# Patient Record
Sex: Male | Born: 1950 | Race: Black or African American | Hispanic: No | Marital: Married | Smoking: Former smoker
Health system: Southern US, Community
[De-identification: ages and names within clinical notes are randomized; demographics above are authoritative.]

## PROBLEM LIST (undated history)

## (undated) DIAGNOSIS — I10 Essential (primary) hypertension: Secondary | ICD-10-CM

## (undated) DIAGNOSIS — N186 End stage renal disease: Secondary | ICD-10-CM

## (undated) DIAGNOSIS — E1129 Type 2 diabetes mellitus with other diabetic kidney complication: Secondary | ICD-10-CM

## (undated) DIAGNOSIS — Z992 Dependence on renal dialysis: Secondary | ICD-10-CM

## (undated) DIAGNOSIS — I2510 Atherosclerotic heart disease of native coronary artery without angina pectoris: Secondary | ICD-10-CM

## (undated) HISTORY — PX: BELOW KNEE LEG AMPUTATION: SUR23

## (undated) HISTORY — PX: PEG PLACEMENT: SHX5437

---

## 2017-08-23 ENCOUNTER — Other Ambulatory Visit (HOSPITAL_COMMUNITY): Payer: Medicaid Other

## 2017-08-23 ENCOUNTER — Inpatient Hospital Stay
Admission: EM | Admit: 2017-08-23 | Discharge: 2017-09-08 | Disposition: A | Discharge status: Other Acute Inpatient Hospital | Payer: Medicaid Other | Source: Other Acute Inpatient Hospital | Attending: Internal Medicine | Admitting: Internal Medicine

## 2017-08-23 DIAGNOSIS — D499 Neoplasm of unspecified behavior of unspecified site: Secondary | ICD-10-CM

## 2017-08-23 DIAGNOSIS — J9690 Respiratory failure, unspecified, unspecified whether with hypoxia or hypercapnia: Secondary | ICD-10-CM

## 2017-08-23 LAB — TSH: TSH: 0.766 u[IU]/mL (ref 0.350–4.500)

## 2017-08-23 MED ORDER — IOPAMIDOL (ISOVUE-300) INJECTION 61%
INTRAVENOUS | Status: AC
  Filled 2017-08-23: qty 50

## 2017-08-24 LAB — CBC
HCT: 29.4 % — ABNORMAL LOW (ref 39.0–52.0)
Hemoglobin: 9.8 g/dL — ABNORMAL LOW (ref 13.0–17.0)
MCH: 26.5 pg (ref 26.0–34.0)
MCHC: 33.3 g/dL (ref 30.0–36.0)
MCV: 79.5 fL (ref 78.0–100.0)
PLATELETS: 289 10*3/uL (ref 150–400)
RBC: 3.7 MIL/uL — ABNORMAL LOW (ref 4.22–5.81)
RDW: 19.8 % — AB (ref 11.5–15.5)
WBC: 10 10*3/uL (ref 4.0–10.5)

## 2017-08-24 LAB — COMPREHENSIVE METABOLIC PANEL
ALBUMIN: 2.3 g/dL — AB (ref 3.5–5.0)
ALT: 39 U/L (ref 17–63)
AST: 30 U/L (ref 15–41)
Alkaline Phosphatase: 123 U/L (ref 38–126)
Anion gap: 14 (ref 5–15)
BUN: 42 mg/dL — AB (ref 6–20)
CHLORIDE: 94 mmol/L — AB (ref 101–111)
CO2: 24 mmol/L (ref 22–32)
CREATININE: 6.11 mg/dL — AB (ref 0.61–1.24)
Calcium: 8.5 mg/dL — ABNORMAL LOW (ref 8.9–10.3)
GFR calc Af Amer: 10 mL/min — ABNORMAL LOW (ref >60)
GFR calc non Af Amer: 9 mL/min — ABNORMAL LOW (ref >60)
GLUCOSE: 199 mg/dL — AB (ref 65–99)
POTASSIUM: 3.5 mmol/L (ref 3.5–5.1)
SODIUM: 132 mmol/L — AB (ref 135–145)
Total Bilirubin: 0.4 mg/dL (ref 0.3–1.2)
Total Protein: 5.7 g/dL — ABNORMAL LOW (ref 6.5–8.1)

## 2017-08-24 LAB — PROTIME-INR
INR: 1.07
Prothrombin Time: 13.9 seconds (ref 11.4–15.2)

## 2017-08-25 MED ORDER — GLUCOSE 40 % PO GEL
15.00 g | ORAL | Status: DC
Start: ? — End: 2017-08-25

## 2017-08-25 MED ORDER — FENTANYL CITRATE (PF) 2500 MCG/50ML IJ SOLN
25.00 | INTRAMUSCULAR | Status: DC
Start: ? — End: 2017-08-25

## 2017-08-25 MED ORDER — GENERIC EXTERNAL MEDICATION
0.00 | Status: DC
Start: ? — End: 2017-08-25

## 2017-08-25 MED ORDER — QUETIAPINE FUMARATE 25 MG PO TABS
25.00 | ORAL_TABLET | ORAL | Status: DC
Start: 2017-08-23 — End: 2017-08-25

## 2017-08-25 MED ORDER — GENERIC EXTERNAL MEDICATION
5.00 | Status: DC
Start: ? — End: 2017-08-25

## 2017-08-25 MED ORDER — CARVEDILOL 12.5 MG PO TABS
25.00 | ORAL_TABLET | ORAL | Status: DC
Start: 2017-08-23 — End: 2017-08-25

## 2017-08-25 MED ORDER — ALBUTEROL SULFATE 1.25 MG/3ML IN NEBU
1.25 | INHALATION_SOLUTION | RESPIRATORY_TRACT | Status: DC
Start: ? — End: 2017-08-25

## 2017-08-25 MED ORDER — ASPIRIN 81 MG PO CHEW
81.00 | CHEWABLE_TABLET | ORAL | Status: DC
Start: 2017-08-24 — End: 2017-08-25

## 2017-08-25 MED ORDER — FAMOTIDINE 20 MG/2ML IV SOLN
20.00 | INTRAVENOUS | Status: DC
Start: 2017-08-24 — End: 2017-08-25

## 2017-08-25 MED ORDER — ACETAMINOPHEN 650 MG/20.3ML PO SOLN
650.00 | ORAL | Status: DC
Start: ? — End: 2017-08-25

## 2017-08-25 MED ORDER — ATORVASTATIN CALCIUM 40 MG PO TABS
40.00 | ORAL_TABLET | ORAL | Status: DC
Start: 2017-08-23 — End: 2017-08-25

## 2017-08-25 MED ORDER — CALCIUM ACETATE (PHOS BINDER) 667 MG PO CAPS
667.00 | ORAL_CAPSULE | ORAL | Status: DC
Start: 2017-08-23 — End: 2017-08-25

## 2017-08-25 MED ORDER — HEPARIN SODIUM (PORCINE) 5000 UNIT/ML IJ SOLN
5000.00 | INTRAMUSCULAR | Status: DC
Start: 2017-08-23 — End: 2017-08-25

## 2017-08-25 MED ORDER — GENERIC EXTERNAL MEDICATION
1.00 | Status: DC
Start: ? — End: 2017-08-25

## 2017-08-25 MED ORDER — VANCOMYCIN HCL 50 MG/ML PO SOLR
125.00 | ORAL | Status: DC
Start: 2017-08-23 — End: 2017-08-25

## 2017-08-25 MED ORDER — HYDRALAZINE HCL 20 MG/ML IJ SOLN
20.00 | INTRAMUSCULAR | Status: DC
Start: ? — End: 2017-08-25

## 2017-08-25 MED ORDER — EPOETIN ALFA 10000 UNIT/ML IJ SOLN
10000.00 | INTRAMUSCULAR | Status: DC
Start: 2017-08-25 — End: 2017-08-25

## 2017-08-25 MED ORDER — DEXTROSE 50 % IV SOLN
12.00 g | INTRAVENOUS | Status: DC
Start: ? — End: 2017-08-25

## 2017-08-25 MED ORDER — INSULIN LISPRO 100 UNIT/ML ~~LOC~~ SOLN
2.00 | SUBCUTANEOUS | Status: DC
Start: 2017-08-23 — End: 2017-08-25

## 2017-08-25 NOTE — Consult Note (Signed)
[facility] CONSULT NOTE    Date: 08/25/2017                  Patient Name:  Michael Moody  MRN: [id number]  DOB: 1950-08-01  Age / Sex: 67 y.o., male         PCP: System, Pcp Not In                 Service Requesting Consult: Hospitalist                 Reason for Consult: Management of ESRD            History of Present Illness: Patient is a 67 y.o. male with a PMHx of end-stage renal disease on hemodialysis, hypertension, diabetes mellitus, coronary disease status post CABG, peripheral vascular disease status post left below the knee amputation, anemia of chronic kidney disease, and secondary hyperparathyroidism, who was admitted to [facility] on 08/23/2017 for ongoing management of respiratory failure, encephalopathy, and end-stage renal disease.  Unfortunately the patient was found at home on July 30, 2017 with PEA arrest.  He was placed on hypothermia protocol initially.  The patient then subsequently had aspiration pneumonia.  He suffered 2 additional cardiac arrest on August 04, 2017 as well as August 22, 2017.  He also had a PEG tube placement performed on August 09, 2017.  He is remained encephalopathic for most of this time.  He also had dialysis support at the outside hospital.   Medications: Current medications:  Humalog insulin sliding scale, Aranesp 100 mcg subcutaneous weekly, aspirin 81 mg daily, Lipitor 40 mg nightly, calcium acetate 667 mg 3 times daily, carvedilol 25 mg twice daily, Pepcid 20 mg daily, heparin 5000 units twice daily, oxycodone 5 mg every 8 hours, Seroquel 25 mg nightly, vancomycin 125 mg oral 4 times daily  Allergies: No known drug allergies   Past Medical History: end-stage renal disease on hemodialysis, hypertension, diabetes mellitus, coronary disease status post CABG, peripheral vascular disease status post left below the knee amputation, anemia of chronic kidney disease, and secondary  hyperparathyroidism  Past Surgical History: Left BKA PEG tube placement PermCath placement  Family History: Unable to obtain from the patient has he is currently intubated.  Social History: Unable to obtain from the patient as he is currently intubated.  Review of Systems: Unable to obtain from the patient is is currently intubated.  Vital Signs: Temperature 98.7 pulse 78 respirations 18 blood pressure 141/65 Weight trends: There were no vitals filed for this visit.  Physical Exam: General: Critically ill appearing  Head: Normocephalic, atraumatic.  Eyes: Anicteric, EOMI  Nose: Mucous membranes moist, not inflammed, nonerythematous.  Throat: ETT in place  Neck: Supple, trachea midline.  Lungs:  Scattered rhonchi, normal effort  Heart: RRR. S1 and S2 normal without gallop, murmur, or rubs.  Abdomen:  BS normoactive. Soft, Nondistended, non-tender.  No masses or organomegaly.  Extremities: 1+ peripheral edema  Neurologic: Not following commands, intubated  Skin: No visible rashes, scars.    Lab results: Basic Metabolic Panel: Recent Labs  Lab 08/24/17 0825  NA 132*  K 3.5  CL 94*  CO2 24  GLUCOSE 199*  BUN 42*  CREATININE 6.11*  CALCIUM 8.5*    Liver Function Tests: Recent Labs  Lab 08/24/17 0825  AST 30  ALT 39  ALKPHOS 123  BILITOT 0.4  PROT 5.7*  ALBUMIN 2.3*   No results for input(s): LIPASE, AMYLASE in the last 168 hours. No  results for input(s): AMMONIA in the last 168 hours.  CBC: Recent Labs  Lab 08/24/17 0825  WBC 10.0  HGB 9.8*  HCT 29.4*  MCV 79.5  PLT 289    Cardiac Enzymes: No results for input(s): CKTOTAL, CKMB, CKMBINDEX, TROPONINI in the last 168 hours.  BNP: Invalid input(s): POCBNP  CBG: No results for input(s): GLUCAP in the last 168 hours.  Microbiology: No results found for this or any previous visit.  Coagulation Studies: Recent Labs    08/24/17 0825  LABPROT 13.9  INR 1.07    Urinalysis: No results  for input(s): COLORURINE, LABSPEC, PHURINE, GLUCOSEU, HGBUR, BILIRUBINUR, KETONESUR, PROTEINUR, UROBILINOGEN, NITRITE, LEUKOCYTESUR in the last 72 hours.  Invalid input(s): APPERANCEUR    Imaging: Dg Abdomen Peg Tube Location  Result Date: 08/23/2017 CLINICAL DATA:  Percutaneous gastrostomy tube placement. EXAM: ABDOMEN - 1 VIEW COMPARISON:  None. FINDINGS: Percutaneous gastrostomy tube is seen in appropriate position, with injected oral contrast material within the stomach. No evidence of contrast leak. The bowel gas pattern is normal. IMPRESSION: Percutaneous gastrostomy tube in appropriate position within the stomach. Electronically Signed   By: Earle Gell M.D.   On: 08/23/2017 17:13      Assessment & Plan: Pt is a 67 y.o. male with a PMHx of end-stage renal disease on hemodialysis, hypertension, diabetes mellitus, coronary disease status post CABG, peripheral vascular disease status post left below the knee amputation, anemia of chronic kidney disease, and secondary hyperparathyroidism, who was admitted to [facility] on 08/23/2017 for ongoing management of respiratory failure, encephalopathy, and end-stage renal disease.   1.  ESRD on HD TTS.  We will plan for hemodialysis again tomorrow.  Serum potassium currently acceptable at 3.5.  2.  Anemia of chronic kidney disease.  Hemoglobin 9.8.  Maintain the patient on Aranesp 100 mcg subcu tennis weekly.  3.  Secondary hyperparathyroidism.  Maintain the patient on calcium acetate and check intact PTH as well as phosphorus.  4.  Acute respiratory failure.  The patient remains on the ventilator.  Suspect that he will need a tracheostomy in the relative near future.

## 2017-08-26 LAB — RENAL FUNCTION PANEL
Albumin: 2.2 g/dL — ABNORMAL LOW (ref 3.5–5.0)
Anion gap: 13 (ref 5–15)
BUN: 67 mg/dL — AB (ref 6–20)
CALCIUM: 9.1 mg/dL (ref 8.9–10.3)
CHLORIDE: 91 mmol/L — AB (ref 101–111)
CO2: 27 mmol/L (ref 22–32)
CREATININE: 8.57 mg/dL — AB (ref 0.61–1.24)
GFR, EST AFRICAN AMERICAN: 7 mL/min — AB (ref >60)
GFR, EST NON AFRICAN AMERICAN: 6 mL/min — AB (ref >60)
Glucose, Bld: 228 mg/dL — ABNORMAL HIGH (ref 65–99)
Phosphorus: 2 mg/dL — ABNORMAL LOW (ref 2.5–4.6)
Potassium: 3.5 mmol/L (ref 3.5–5.1)
Sodium: 131 mmol/L — ABNORMAL LOW (ref 135–145)

## 2017-08-26 LAB — CBC
HCT: 29.1 % — ABNORMAL LOW (ref 39.0–52.0)
Hemoglobin: 9.4 g/dL — ABNORMAL LOW (ref 13.0–17.0)
MCH: 25.4 pg — ABNORMAL LOW (ref 26.0–34.0)
MCHC: 32.3 g/dL (ref 30.0–36.0)
MCV: 78.6 fL (ref 78.0–100.0)
PLATELETS: 359 10*3/uL (ref 150–400)
RBC: 3.7 MIL/uL — AB (ref 4.22–5.81)
RDW: 20.1 % — AB (ref 11.5–15.5)
WBC: 8.6 10*3/uL (ref 4.0–10.5)

## 2017-08-27 LAB — HEPATITIS B SURFACE ANTIGEN: Hepatitis B Surface Ag: NEGATIVE

## 2017-08-27 LAB — HEPATITIS B SURFACE ANTIBODY,QUALITATIVE: HEP B S AB: REACTIVE

## 2017-08-27 LAB — HEPATITIS B CORE ANTIBODY, TOTAL: HEP B C TOTAL AB: POSITIVE — AB

## 2017-08-27 LAB — PARATHYROID HORMONE, INTACT (NO CA): PTH: 58 pg/mL (ref 15–65)

## 2017-08-27 NOTE — Progress Notes (Signed)
  [facility]  ROUNDING NOTE   Subjective:  Patient seen at bedside. Remains critically ill. Remains on the ventilator at this time.    Objective:  Vital signs in last 24 hours:  Temperature 97.3 pulse 67 respirations 7 blood pressure 115/67  Physical Exam: General: Critically ill appearing  Head: ETT in place  Eyes: Anicteric  Neck: Supple, trachea midline  Lungs:  Scattered rhonchi, vent assisted  Heart: S1S2 no rubs  Abdomen:  Soft, nontender, bowel sounds present  Extremities: trace peripheral edema, L BKA  Neurologic: Arousable, hands in restraints  Skin: No lesions  Access: L IJ Permcath    Basic Metabolic Panel: Recent Labs  Lab 08/24/17 0825 08/26/17 0546  NA 132* 131*  K 3.5 3.5  CL 94* 91*  CO2 24 27  GLUCOSE 199* 228*  BUN 42* 67*  CREATININE 6.11* 8.57*  CALCIUM 8.5* 9.1  PHOS  --  2.0*    Liver Function Tests: Recent Labs  Lab 08/24/17 0825 08/26/17 0546  AST 30  --   ALT 39  --   ALKPHOS 123  --   BILITOT 0.4  --   PROT 5.7*  --   ALBUMIN 2.3* 2.2*   No results for input(s): LIPASE, AMYLASE in the last 168 hours. No results for input(s): AMMONIA in the last 168 hours.  CBC: Recent Labs  Lab 08/24/17 0825 08/26/17 0546  WBC 10.0 8.6  HGB 9.8* 9.4*  HCT 29.4* 29.1*  MCV 79.5 78.6  PLT 289 359    Cardiac Enzymes: No results for input(s): CKTOTAL, CKMB, CKMBINDEX, TROPONINI in the last 168 hours.  BNP: Invalid input(s): POCBNP  CBG: No results for input(s): GLUCAP in the last 168 hours.  Microbiology: No results found for this or any previous visit.  Coagulation Studies: No results for input(s): LABPROT, INR in the last 72 hours.  Urinalysis: No results for input(s): COLORURINE, LABSPEC, PHURINE, GLUCOSEU, HGBUR, BILIRUBINUR, KETONESUR, PROTEINUR, UROBILINOGEN, NITRITE, LEUKOCYTESUR in the last 72 hours.  Invalid input(s): APPERANCEUR    Imaging: No results found.   Medications:        Assessment/ Plan:  67 y.o. male with a PMHx of end-stage renal disease on hemodialysis, hypertension, diabetes mellitus, coronary disease status post CABG, peripheral vascular disease status post left below the knee amputation, anemia of chronic kidney disease, and secondary hyperparathyroidism, who was admitted to [facility] on 08/23/2017 for ongoing management of respiratory failure, encephalopathy, and end-stage renal disease.   1.  ESRD on HD TTS.    Patient will continue hemodialysis on Tuesday, Thursday, Saturday.  Next treatment therefore will be tomorrow.  Ultrafiltration target 1.5 kg.  2.  Anemia of chronic kidney disease.    Maintain the patient on Aranesp 60 mcg subcutaneous weekly.  3.  Secondary hyperparathyroidism.    Phosphorus 2.0 and acceptable.  Continue to monitor.  4.  Acute respiratory failure.  Patient maintained on the ventilator at this time.  Tracheostomy being considered.     LOS: 0 Tymesha Ditmore 1/30/20194:05 PM

## 2017-08-28 LAB — CBC
HCT: 27 % — ABNORMAL LOW (ref 39.0–52.0)
Hemoglobin: 8.5 g/dL — ABNORMAL LOW (ref 13.0–17.0)
MCH: 24.7 pg — AB (ref 26.0–34.0)
MCHC: 31.5 g/dL (ref 30.0–36.0)
MCV: 78.5 fL (ref 78.0–100.0)
PLATELETS: 276 10*3/uL (ref 150–400)
RBC: 3.44 MIL/uL — AB (ref 4.22–5.81)
RDW: 19.5 % — ABNORMAL HIGH (ref 11.5–15.5)
WBC: 7.8 10*3/uL (ref 4.0–10.5)

## 2017-08-28 LAB — RENAL FUNCTION PANEL
Albumin: 2.2 g/dL — ABNORMAL LOW (ref 3.5–5.0)
Anion gap: 13 (ref 5–15)
BUN: 70 mg/dL — ABNORMAL HIGH (ref 6–20)
CALCIUM: 9 mg/dL (ref 8.9–10.3)
CO2: 23 mmol/L (ref 22–32)
CREATININE: 8.53 mg/dL — AB (ref 0.61–1.24)
Chloride: 95 mmol/L — ABNORMAL LOW (ref 101–111)
GFR, EST AFRICAN AMERICAN: 7 mL/min — AB (ref >60)
GFR, EST NON AFRICAN AMERICAN: 6 mL/min — AB (ref >60)
Glucose, Bld: 182 mg/dL — ABNORMAL HIGH (ref 65–99)
Phosphorus: 2.1 mg/dL — ABNORMAL LOW (ref 2.5–4.6)
Potassium: 4.4 mmol/L (ref 3.5–5.1)
SODIUM: 131 mmol/L — AB (ref 135–145)

## 2017-08-29 NOTE — Progress Notes (Signed)
  [facility]  ROUNDING NOTE   Subjective:  Patient remains critically ill at this point in time. He remains on the ventilator. It appears that he will most likely be getting a tracheostomy.    Objective:  Vital signs in last 24 hours:  Temperature 97.4 pulse 70 respirations 18 blood pressure 107/67  Physical Exam: General: Critically ill appearing  Head: ETT in place  Eyes: Anicteric  Neck: Supple, trachea midline  Lungs:  Scattered rhonchi, vent assisted  Heart: S1S2 no rubs  Abdomen:  Soft, nontender, bowel sounds present  Extremities: trace peripheral edema, L BKA  Neurologic: Arousable, hands in restraints  Skin: No lesions  Access: L IJ Permcath    Basic Metabolic Panel: Recent Labs  Lab 08/24/17 0825 08/26/17 0546 08/28/17 0739  NA 132* 131* 131*  K 3.5 3.5 4.4  CL 94* 91* 95*  CO2 24 27 23   GLUCOSE 199* 228* 182*  BUN 42* 67* 70*  CREATININE 6.11* 8.57* 8.53*  CALCIUM 8.5* 9.1 9.0  PHOS  --  2.0* 2.1*    Liver Function Tests: Recent Labs  Lab 08/24/17 0825 08/26/17 0546 08/28/17 0739  AST 30  --   --   ALT 39  --   --   ALKPHOS 123  --   --   BILITOT 0.4  --   --   PROT 5.7*  --   --   ALBUMIN 2.3* 2.2* 2.2*   No results for input(s): LIPASE, AMYLASE in the last 168 hours. No results for input(s): AMMONIA in the last 168 hours.  CBC: Recent Labs  Lab 08/24/17 0825 08/26/17 0546 08/28/17 0739  WBC 10.0 8.6 7.8  HGB 9.8* 9.4* 8.5*  HCT 29.4* 29.1* 27.0*  MCV 79.5 78.6 78.5  PLT 289 359 276    Cardiac Enzymes: No results for input(s): CKTOTAL, CKMB, CKMBINDEX, TROPONINI in the last 168 hours.  BNP: Invalid input(s): POCBNP  CBG: No results for input(s): GLUCAP in the last 168 hours.  Microbiology: No results found for this or any previous visit.  Coagulation Studies: No results for input(s): LABPROT, INR in the last 72 hours.  Urinalysis: No results for input(s): COLORURINE, LABSPEC, PHURINE, GLUCOSEU, HGBUR,  BILIRUBINUR, KETONESUR, PROTEINUR, UROBILINOGEN, NITRITE, LEUKOCYTESUR in the last 72 hours.  Invalid input(s): APPERANCEUR    Imaging: No results found.   Medications:       Assessment/ Plan:  67 y.o. male with a PMHx of end-stage renal disease on hemodialysis, hypertension, diabetes mellitus, coronary disease status post CABG, peripheral vascular disease status post left below the knee amputation, anemia of chronic kidney disease, and secondary hyperparathyroidism, who was admitted to [facility] on 08/23/2017 for ongoing management of respiratory failure, encephalopathy, and end-stage renal disease.   1.  ESRD on HD TTS.    We will plan for dialysis again tomorrow. Serum electrolytes appear acceptable at the moment.  2.  Anemia of chronic kidney disease.    Hemoglobin down a bit to 8.5.  Maintain the patient on Aranesp 177mcg [place] weekly.   3.  Secondary hyperparathyroidism.    Phosphorus currently 2.2 and acceptable.  4.  Acute respiratory failure.  Patient has failed multiple weaning trials.  Has low tidal volume during weaning trials.  Maintain ventilatory support.  Most likely patient will have tracheostomy next week.   LOS: 0 Juneau Doughman 2/1/20198:24 AM

## 2017-08-30 LAB — RENAL FUNCTION PANEL
ALBUMIN: 2.2 g/dL — AB (ref 3.5–5.0)
ANION GAP: 12 (ref 5–15)
BUN: 52 mg/dL — AB (ref 6–20)
CHLORIDE: 94 mmol/L — AB (ref 101–111)
CO2: 24 mmol/L (ref 22–32)
Calcium: 8.7 mg/dL — ABNORMAL LOW (ref 8.9–10.3)
Creatinine, Ser: 6.74 mg/dL — ABNORMAL HIGH (ref 0.61–1.24)
GFR calc Af Amer: 9 mL/min — ABNORMAL LOW (ref >60)
GFR calc non Af Amer: 8 mL/min — ABNORMAL LOW (ref >60)
GLUCOSE: 185 mg/dL — AB (ref 65–99)
PHOSPHORUS: 2.3 mg/dL — AB (ref 2.5–4.6)
POTASSIUM: 4 mmol/L (ref 3.5–5.1)
Sodium: 130 mmol/L — ABNORMAL LOW (ref 135–145)

## 2017-08-30 LAB — CBC
HEMATOCRIT: 26.1 % — AB (ref 39.0–52.0)
HEMOGLOBIN: 8.4 g/dL — AB (ref 13.0–17.0)
MCH: 25.5 pg — AB (ref 26.0–34.0)
MCHC: 32.2 g/dL (ref 30.0–36.0)
MCV: 79.3 fL (ref 78.0–100.0)
Platelets: 284 10*3/uL (ref 150–400)
RBC: 3.29 MIL/uL — ABNORMAL LOW (ref 4.22–5.81)
RDW: 19.8 % — AB (ref 11.5–15.5)
WBC: 5 10*3/uL (ref 4.0–10.5)

## 2017-09-01 NOTE — Progress Notes (Signed)
  [facility]  ROUNDING NOTE   Subjective:  Patient seen at bedside. He remains on the ventilator at the moment. Unclear as to when he will receive a tracheostomy.   Objective:  Vital signs in last 24 hours:  Pulse 73 respirations 10 blood pressure 124/69 98% pulse ox  Physical Exam: General: Critically ill appearing  Head: ETT in place  Eyes: Anicteric  Neck: Supple, trachea midline  Lungs:  Scattered rhonchi, vent assisted  Heart: S1S2 no rubs  Abdomen:  Soft, nontender, bowel sounds present  Extremities: trace peripheral edema, L BKA  Neurologic: Arousable, hands in restraints  Skin: No lesions  Access: L IJ Permcath    Basic Metabolic Panel: Recent Labs  Lab 08/26/17 0546 08/28/17 0739 08/30/17 0528  NA 131* 131* 130*  K 3.5 4.4 4.0  CL 91* 95* 94*  CO2 27 23 24   GLUCOSE 228* 182* 185*  BUN 67* 70* 52*  CREATININE 8.57* 8.53* 6.74*  CALCIUM 9.1 9.0 8.7*  PHOS 2.0* 2.1* 2.3*    Liver Function Tests: Recent Labs  Lab 08/26/17 0546 08/28/17 0739 08/30/17 0528  ALBUMIN 2.2* 2.2* 2.2*   No results for input(s): LIPASE, AMYLASE in the last 168 hours. No results for input(s): AMMONIA in the last 168 hours.  CBC: Recent Labs  Lab 08/26/17 0546 08/28/17 0739 08/30/17 0528  WBC 8.6 7.8 5.0  HGB 9.4* 8.5* 8.4*  HCT 29.1* 27.0* 26.1*  MCV 78.6 78.5 79.3  PLT 359 276 284    Cardiac Enzymes: No results for input(s): CKTOTAL, CKMB, CKMBINDEX, TROPONINI in the last 168 hours.  BNP: Invalid input(s): POCBNP  CBG: No results for input(s): GLUCAP in the last 168 hours.  Microbiology: No results found for this or any previous visit.  Coagulation Studies: No results for input(s): LABPROT, INR in the last 72 hours.  Urinalysis: No results for input(s): COLORURINE, LABSPEC, PHURINE, GLUCOSEU, HGBUR, BILIRUBINUR, KETONESUR, PROTEINUR, UROBILINOGEN, NITRITE, LEUKOCYTESUR in the last 72 hours.  Invalid input(s): APPERANCEUR     Imaging: No results found.   Medications:       Assessment/ Plan:  67 y.o. male with a PMHx of end-stage renal disease on hemodialysis, hypertension, diabetes mellitus, coronary disease status post CABG, peripheral vascular disease status post left below the knee amputation, anemia of chronic kidney disease, and secondary hyperparathyroidism, who was admitted to [facility] on 08/23/2017 for ongoing management of respiratory failure, encephalopathy, and end-stage renal disease.   1.  ESRD on HD TTS.    Patient seen and evaluated at bedside.  No urgent indication for dialysis today.  We will plan for dialysis again tomorrow.  2.  Anemia of chronic kidney disease.    Hemoglobin currently 8.4.  We will maintain the patient on Aranesp 100 mcg subcutaneous weekly.  3.  Secondary hyperparathyroidism.    Repeat serum phosphorus tomorrow..  4.  Acute respiratory failure.  As before suspect that the patient will require tracheostomy placement in the relative near future.   LOS: 0 Ralynn San 2/4/20192:49 PM

## 2017-09-02 ENCOUNTER — Other Ambulatory Visit (HOSPITAL_COMMUNITY): Payer: Medicaid Other

## 2017-09-02 LAB — CBC
HCT: 24.8 % — ABNORMAL LOW (ref 39.0–52.0)
HEMOGLOBIN: 7.5 g/dL — AB (ref 13.0–17.0)
MCH: 24.1 pg — ABNORMAL LOW (ref 26.0–34.0)
MCHC: 30.2 g/dL (ref 30.0–36.0)
MCV: 79.7 fL (ref 78.0–100.0)
Platelets: 317 10*3/uL (ref 150–400)
RBC: 3.11 MIL/uL — AB (ref 4.22–5.81)
RDW: 19.6 % — ABNORMAL HIGH (ref 11.5–15.5)
WBC: 5.8 10*3/uL (ref 4.0–10.5)

## 2017-09-02 LAB — BLOOD GAS, ARTERIAL
ACID-BASE EXCESS: 4.4 mmol/L — AB (ref 0.0–2.0)
Bicarbonate: 29 mmol/L — ABNORMAL HIGH (ref 20.0–28.0)
FIO2: 0.28
LHR: 18 {breaths}/min
MECHVT: 500 mL
O2 SAT: 98.3 %
PATIENT TEMPERATURE: 98.6
PCO2 ART: 48 mmHg (ref 32.0–48.0)
PEEP/CPAP: 5 cmH2O
PO2 ART: 117 mmHg — AB (ref 83.0–108.0)
pH, Arterial: 7.398 (ref 7.350–7.450)

## 2017-09-02 LAB — RENAL FUNCTION PANEL
ANION GAP: 14 (ref 5–15)
Albumin: 2.3 g/dL — ABNORMAL LOW (ref 3.5–5.0)
BUN: 59 mg/dL — ABNORMAL HIGH (ref 6–20)
CHLORIDE: 90 mmol/L — AB (ref 101–111)
CO2: 25 mmol/L (ref 22–32)
Calcium: 9 mg/dL (ref 8.9–10.3)
Creatinine, Ser: 6.84 mg/dL — ABNORMAL HIGH (ref 0.61–1.24)
GFR calc non Af Amer: 7 mL/min — ABNORMAL LOW (ref >60)
GFR, EST AFRICAN AMERICAN: 9 mL/min — AB (ref >60)
Glucose, Bld: 149 mg/dL — ABNORMAL HIGH (ref 65–99)
POTASSIUM: 4 mmol/L (ref 3.5–5.1)
Phosphorus: 3.8 mg/dL (ref 2.5–4.6)
Sodium: 129 mmol/L — ABNORMAL LOW (ref 135–145)

## 2017-09-03 NOTE — Progress Notes (Signed)
[facility]  ROUNDING NOTE   Subjective:  The patient remains on the ventilator at this time. He continues dialysis on a Tuesday, Thursday, Saturday schedule.   Objective:  Vital signs in last 24 hours:  Temperature 98.8 pulse 72 respirations 18 blood pressure 108/58  Physical Exam: General: Critically ill appearing  Head: ETT in place  Eyes: Anicteric  Neck: Supple, trachea midline  Lungs:  Scattered rhonchi, vent assisted  Heart: S1S2 no rubs  Abdomen:  Soft, nontender, bowel sounds present  Extremities: trace peripheral edema, L BKA  Neurologic: Arousable, hands in restraints  Skin: No lesions  Access: L IJ Permcath    Basic Metabolic Panel: Recent Labs  Lab 08/28/17 0739 08/30/17 0528 09/02/17 0522  NA 131* 130* 129*  K 4.4 4.0 4.0  CL 95* 94* 90*  CO2 23 24 25   GLUCOSE 182* 185* 149*  BUN 70* 52* 59*  CREATININE 8.53* 6.74* 6.84*  CALCIUM 9.0 8.7* 9.0  PHOS 2.1* 2.3* 3.8    Liver Function Tests: Recent Labs  Lab 08/28/17 0739 08/30/17 0528 09/02/17 0522  ALBUMIN 2.2* 2.2* 2.3*   No results for input(s): LIPASE, AMYLASE in the last 168 hours. No results for input(s): AMMONIA in the last 168 hours.  CBC: Recent Labs  Lab 08/28/17 0739 08/30/17 0528 09/02/17 0522  WBC 7.8 5.0 5.8  HGB 8.5* 8.4* 7.5*  HCT 27.0* 26.1* 24.8*  MCV 78.5 79.3 79.7  PLT 276 284 317    Cardiac Enzymes: No results for input(s): CKTOTAL, CKMB, CKMBINDEX, TROPONINI in the last 168 hours.  BNP: Invalid input(s): POCBNP  CBG: No results for input(s): GLUCAP in the last 168 hours.  Microbiology: No results found for this or any previous visit.  Coagulation Studies: No results for input(s): LABPROT, INR in the last 72 hours.  Urinalysis: No results for input(s): COLORURINE, LABSPEC, PHURINE, GLUCOSEU, HGBUR, BILIRUBINUR, KETONESUR, PROTEINUR, UROBILINOGEN, NITRITE, LEUKOCYTESUR in the last 72 hours.  Invalid input(s): APPERANCEUR    Imaging: Dg  Chest Port 1 View  Result Date: 09/02/2017 CLINICAL DATA:  Respiratory failure. EXAM: PORTABLE CHEST 1 VIEW COMPARISON:  08/23/2017 and prior radiographs FINDINGS: Cardiomegaly and median sternotomy again noted. A left central venous catheter with tip overlying the superior cavoatrial junction, endotracheal tube with tip 4.5 cm above the carina and right subclavian/axillary vascular graft again noted. Mild left basilar atelectasis/airspace disease again noted. There is no evidence of pneumothorax. IMPRESSION: Unchanged appearance of the chest with support apparatus as described. Continued left basilar atelectasis/airspace disease. Electronically Signed   By: Margarette Canada M.D.   On: 09/02/2017 19:28     Medications:       Assessment/ Plan:  67 y.o. male with a PMHx of end-stage renal disease on hemodialysis, hypertension, diabetes mellitus, coronary disease status post CABG, peripheral vascular disease status post left below the knee amputation, anemia of chronic kidney disease, and secondary hyperparathyroidism, who was admitted to [facility] on 08/23/2017 for ongoing management of respiratory failure, encephalopathy, and end-stage renal disease.   1.  ESRD on HD TTS.    We will prepare dialysis orders for tomorrow.  Ultrafiltration target 1.5 kg.  2.  Anemia of chronic kidney disease.    Hemoglobin dropping and is currently 7.5.  Significant drop noted.  Consider blood transfusion for hemoglobin of 7 or less.  Maintain the patient on Aranesp 100 mcg subcutaneous weekly.  3.  Secondary hyperparathyroidism.    Phosphorus 3.8 and at target.  Continue to monitor.  4.  Acute respiratory failure.  Patient remains on the ventilator and hopefully will be getting a tracheostomy in the relative near future.  LOS: 0 Michael Moody 2/6/20194:08 PM

## 2017-09-04 LAB — CBC
HEMATOCRIT: 25.6 % — AB (ref 39.0–52.0)
Hemoglobin: 8 g/dL — ABNORMAL LOW (ref 13.0–17.0)
MCH: 24.6 pg — ABNORMAL LOW (ref 26.0–34.0)
MCHC: 31.3 g/dL (ref 30.0–36.0)
MCV: 78.8 fL (ref 78.0–100.0)
PLATELETS: 329 10*3/uL (ref 150–400)
RBC: 3.25 MIL/uL — ABNORMAL LOW (ref 4.22–5.81)
RDW: 19.5 % — ABNORMAL HIGH (ref 11.5–15.5)
WBC: 6.4 10*3/uL (ref 4.0–10.5)

## 2017-09-04 LAB — RENAL FUNCTION PANEL
Albumin: 2.2 g/dL — ABNORMAL LOW (ref 3.5–5.0)
Anion gap: 14 (ref 5–15)
BUN: 52 mg/dL — AB (ref 6–20)
CALCIUM: 8.7 mg/dL — AB (ref 8.9–10.3)
CHLORIDE: 94 mmol/L — AB (ref 101–111)
CO2: 23 mmol/L (ref 22–32)
CREATININE: 5.9 mg/dL — AB (ref 0.61–1.24)
GFR calc Af Amer: 10 mL/min — ABNORMAL LOW (ref >60)
GFR calc non Af Amer: 9 mL/min — ABNORMAL LOW (ref >60)
GLUCOSE: 177 mg/dL — AB (ref 65–99)
Phosphorus: 2.4 mg/dL — ABNORMAL LOW (ref 2.5–4.6)
Potassium: 3.6 mmol/L (ref 3.5–5.1)
SODIUM: 131 mmol/L — AB (ref 135–145)

## 2017-09-05 NOTE — Progress Notes (Signed)
  [facility]  ROUNDING NOTE   Subjective:  Patient awake and alert this a.m. He continues to be in wrist restraints. He remains on the ventilator. There are plans for tracheostomy placement next week.   Objective:  Vital signs in last 24 hours:  Temperature 98 pulse 68 respirations 13 blood pressure 124/70 Physical Exam: General: Critically ill appearing  Head: ETT in place  Eyes: Anicteric  Neck: Supple, trachea midline  Lungs:  Scattered rhonchi, vent assisted  Heart: S1S2 no rubs  Abdomen:  Soft, nontender, bowel sounds present  Extremities: trace peripheral edema, L BKA  Neurologic: Arousable, hands in restraints  Skin: No lesions  Access: L IJ Permcath    Basic Metabolic Panel: Recent Labs  Lab 08/30/17 0528 09/02/17 0522 09/04/17 0537  NA 130* 129* 131*  K 4.0 4.0 3.6  CL 94* 90* 94*  CO2 24 25 23   GLUCOSE 185* 149* 177*  BUN 52* 59* 52*  CREATININE 6.74* 6.84* 5.90*  CALCIUM 8.7* 9.0 8.7*  PHOS 2.3* 3.8 2.4*    Liver Function Tests: Recent Labs  Lab 08/30/17 0528 09/02/17 0522 09/04/17 0537  ALBUMIN 2.2* 2.3* 2.2*   No results for input(s): LIPASE, AMYLASE in the last 168 hours. No results for input(s): AMMONIA in the last 168 hours.  CBC: Recent Labs  Lab 08/30/17 0528 09/02/17 0522 09/04/17 0537  WBC 5.0 5.8 6.4  HGB 8.4* 7.5* 8.0*  HCT 26.1* 24.8* 25.6*  MCV 79.3 79.7 78.8  PLT 284 317 329    Cardiac Enzymes: No results for input(s): CKTOTAL, CKMB, CKMBINDEX, TROPONINI in the last 168 hours.  BNP: Invalid input(s): POCBNP  CBG: No results for input(s): GLUCAP in the last 168 hours.  Microbiology: No results found for this or any previous visit.  Coagulation Studies: No results for input(s): LABPROT, INR in the last 72 hours.  Urinalysis: No results for input(s): COLORURINE, LABSPEC, PHURINE, GLUCOSEU, HGBUR, BILIRUBINUR, KETONESUR, PROTEINUR, UROBILINOGEN, NITRITE, LEUKOCYTESUR in the last 72 hours.  Invalid  input(s): APPERANCEUR    Imaging: No results found.   Medications:       Assessment/ Plan:  67 y.o. male with a PMHx of end-stage renal disease on hemodialysis, hypertension, diabetes mellitus, coronary disease status post CABG, peripheral vascular disease status post left below the knee amputation, anemia of chronic kidney disease, and secondary hyperparathyroidism, who was admitted to [facility] on 08/23/2017 for ongoing management of respiratory failure, encephalopathy, and end-stage renal disease.   1.  ESRD on HD TTS.    Continue patient on hemodialysis on Tuesday, Thursday, Saturday.  We will plan for dialysis again tomorrow.  Orders to be prepared.  2.  Anemia of chronic kidney disease.    Hemoglobin is up a bit to 8.0.  Maintain the patient on Aranesp 100 mcg subcutaneous weekly.  3.  Secondary hyperparathyroidism.    Follow-up serum phosphorus tomorrow.  4.  Acute respiratory failure.  Plans for tracheostomy for next week are noted.  Otherwise continue ventilatory support for now.  LOS: 0 Kingdom Vanzanten 2/8/20198:35 AM

## 2017-09-06 LAB — HEPATITIS PANEL, ACUTE
HCV Ab: 0.2 s/co ratio (ref 0.0–0.9)
Hep A IgM: NEGATIVE
Hep B C IgM: NEGATIVE
Hepatitis B Surface Ag: NEGATIVE

## 2017-09-06 LAB — CBC
HCT: 27.4 % — ABNORMAL LOW (ref 39.0–52.0)
HEMOGLOBIN: 8.4 g/dL — AB (ref 13.0–17.0)
MCH: 24.7 pg — AB (ref 26.0–34.0)
MCHC: 30.7 g/dL (ref 30.0–36.0)
MCV: 80.6 fL (ref 78.0–100.0)
Platelets: 348 10*3/uL (ref 150–400)
RBC: 3.4 MIL/uL — AB (ref 4.22–5.81)
RDW: 19.4 % — ABNORMAL HIGH (ref 11.5–15.5)
WBC: 6.5 10*3/uL (ref 4.0–10.5)

## 2017-09-06 LAB — RENAL FUNCTION PANEL
ANION GAP: 12 (ref 5–15)
Albumin: 2.3 g/dL — ABNORMAL LOW (ref 3.5–5.0)
BUN: 40 mg/dL — ABNORMAL HIGH (ref 6–20)
CALCIUM: 8.9 mg/dL (ref 8.9–10.3)
CHLORIDE: 95 mmol/L — AB (ref 101–111)
CO2: 26 mmol/L (ref 22–32)
CREATININE: 4.98 mg/dL — AB (ref 0.61–1.24)
GFR, EST AFRICAN AMERICAN: 13 mL/min — AB (ref >60)
GFR, EST NON AFRICAN AMERICAN: 11 mL/min — AB (ref >60)
Glucose, Bld: 143 mg/dL — ABNORMAL HIGH (ref 65–99)
Phosphorus: 3.2 mg/dL (ref 2.5–4.6)
Potassium: 4.4 mmol/L (ref 3.5–5.1)
Sodium: 133 mmol/L — ABNORMAL LOW (ref 135–145)

## 2017-09-06 LAB — HIV ANTIBODY (ROUTINE TESTING W REFLEX): HIV SCREEN 4TH GENERATION: NONREACTIVE

## 2017-09-06 LAB — HEPATITIS B SURFACE ANTIGEN: HEP B S AG: NEGATIVE

## 2017-09-06 LAB — HEPATITIS B CORE ANTIBODY, TOTAL: HEP B C TOTAL AB: POSITIVE — AB

## 2017-09-06 LAB — HEPATITIS B SURFACE ANTIBODY,QUALITATIVE: Hep B S Ab: REACTIVE

## 2017-09-08 ENCOUNTER — Ambulatory Visit (HOSPITAL_COMMUNITY): Payer: No Typology Code available for payment source | Admitting: Anesthesiology

## 2017-09-08 ENCOUNTER — Inpatient Hospital Stay
Admission: AC | Admit: 2017-09-08 | Discharge: 2017-11-03 | Disposition: A | Discharge status: Skilled Nursing Facility | Payer: Medicaid Other | Source: Other Acute Inpatient Hospital | Attending: Internal Medicine | Admitting: Internal Medicine

## 2017-09-08 ENCOUNTER — Encounter
Admission: EM | Disposition: A | Discharge status: Other Acute Inpatient Hospital | Payer: Self-pay | Source: Other Acute Inpatient Hospital | Attending: Internal Medicine

## 2017-09-08 ENCOUNTER — Encounter (HOSPITAL_COMMUNITY)
Admission: RE | Disposition: A | Discharge status: Skilled Nursing Facility | Payer: Self-pay | Source: Ambulatory Visit | Attending: Otolaryngology

## 2017-09-08 ENCOUNTER — Ambulatory Visit (HOSPITAL_COMMUNITY)
Admission: RE | Admit: 2017-09-08 | Discharge: 2017-09-08 | Disposition: A | Discharge status: Skilled Nursing Facility | Payer: No Typology Code available for payment source | Source: Ambulatory Visit | Attending: Otolaryngology | Admitting: Otolaryngology

## 2017-09-08 ENCOUNTER — Encounter (HOSPITAL_COMMUNITY): Payer: Medicaid Other | Admitting: Certified Registered Nurse Anesthetist

## 2017-09-08 DIAGNOSIS — J9620 Acute and chronic respiratory failure, unspecified whether with hypoxia or hypercapnia: Secondary | ICD-10-CM | POA: Insufficient documentation

## 2017-09-08 DIAGNOSIS — N186 End stage renal disease: Secondary | ICD-10-CM | POA: Diagnosis not present

## 2017-09-08 DIAGNOSIS — I120 Hypertensive chronic kidney disease with stage 5 chronic kidney disease or end stage renal disease: Secondary | ICD-10-CM | POA: Diagnosis not present

## 2017-09-08 DIAGNOSIS — Z992 Dependence on renal dialysis: Secondary | ICD-10-CM | POA: Diagnosis not present

## 2017-09-08 DIAGNOSIS — I2510 Atherosclerotic heart disease of native coronary artery without angina pectoris: Secondary | ICD-10-CM | POA: Diagnosis present

## 2017-09-08 DIAGNOSIS — N2581 Secondary hyperparathyroidism of renal origin: Secondary | ICD-10-CM | POA: Diagnosis not present

## 2017-09-08 DIAGNOSIS — R509 Fever, unspecified: Secondary | ICD-10-CM

## 2017-09-08 DIAGNOSIS — J9621 Acute and chronic respiratory failure with hypoxia: Secondary | ICD-10-CM

## 2017-09-08 DIAGNOSIS — Z9911 Dependence on respirator [ventilator] status: Secondary | ICD-10-CM | POA: Diagnosis not present

## 2017-09-08 DIAGNOSIS — D631 Anemia in chronic kidney disease: Secondary | ICD-10-CM | POA: Insufficient documentation

## 2017-09-08 DIAGNOSIS — E1129 Type 2 diabetes mellitus with other diabetic kidney complication: Secondary | ICD-10-CM | POA: Diagnosis present

## 2017-09-08 HISTORY — DX: Type 2 diabetes mellitus with other diabetic kidney complication: E11.29

## 2017-09-08 HISTORY — DX: Essential (primary) hypertension: I10

## 2017-09-08 HISTORY — DX: End stage renal disease: N18.6

## 2017-09-08 HISTORY — DX: Atherosclerotic heart disease of native coronary artery without angina pectoris: I25.10

## 2017-09-08 HISTORY — DX: Dependence on renal dialysis: Z99.2

## 2017-09-08 HISTORY — PX: TRACHEOSTOMY TUBE PLACEMENT: SHX814

## 2017-09-08 LAB — CBC
HEMATOCRIT: 24.2 % — AB (ref 39.0–52.0)
Hemoglobin: 7.4 g/dL — ABNORMAL LOW (ref 13.0–17.0)
MCH: 24.6 pg — AB (ref 26.0–34.0)
MCHC: 30.6 g/dL (ref 30.0–36.0)
MCV: 80.4 fL (ref 78.0–100.0)
PLATELETS: 359 10*3/uL (ref 150–400)
RBC: 3.01 MIL/uL — ABNORMAL LOW (ref 4.22–5.81)
RDW: 19.4 % — AB (ref 11.5–15.5)
WBC: 8 10*3/uL (ref 4.0–10.5)

## 2017-09-08 LAB — RENAL FUNCTION PANEL
Albumin: 2.3 g/dL — ABNORMAL LOW (ref 3.5–5.0)
Anion gap: 13 (ref 5–15)
BUN: 67 mg/dL — AB (ref 6–20)
CHLORIDE: 95 mmol/L — AB (ref 101–111)
CO2: 25 mmol/L (ref 22–32)
CREATININE: 6.89 mg/dL — AB (ref 0.61–1.24)
Calcium: 8.9 mg/dL (ref 8.9–10.3)
GFR calc Af Amer: 9 mL/min — ABNORMAL LOW (ref >60)
GFR, EST NON AFRICAN AMERICAN: 7 mL/min — AB (ref >60)
GLUCOSE: 115 mg/dL — AB (ref 65–99)
POTASSIUM: 4.3 mmol/L (ref 3.5–5.1)
Phosphorus: 3.6 mg/dL (ref 2.5–4.6)
Sodium: 133 mmol/L — ABNORMAL LOW (ref 135–145)

## 2017-09-08 SURGERY — CREATION, TRACHEOSTOMY
Anesthesia: General | Site: Neck

## 2017-09-08 SURGERY — CREATION, TRACHEOSTOMY
Anesthesia: General

## 2017-09-08 MED ORDER — PHENYLEPHRINE HCL 10 MG/ML IJ SOLN
INTRAVENOUS | Status: DC | PRN
  Administered 2017-09-08: 20 ug/min via INTRAVENOUS

## 2017-09-08 MED ORDER — LACTATED RINGERS IV SOLN
INTRAVENOUS | Status: DC | PRN
  Administered 2017-09-08: 12:00:00 via INTRAVENOUS

## 2017-09-08 MED ORDER — SODIUM CHLORIDE 0.9 % IV SOLN
INTRAVENOUS | Status: DC | PRN
  Administered 2017-09-08: 13:00:00 via INTRAVENOUS

## 2017-09-08 MED ORDER — 0.9 % SODIUM CHLORIDE (POUR BTL) OPTIME
TOPICAL | Status: DC | PRN
  Administered 2017-09-08: 1000 mL

## 2017-09-08 MED ORDER — LIDOCAINE-EPINEPHRINE 1 %-1:100000 IJ SOLN
INTRAMUSCULAR | Status: DC | PRN
  Administered 2017-09-08: 3 mL

## 2017-09-08 MED ORDER — EPHEDRINE SULFATE 50 MG/ML IJ SOLN
INTRAMUSCULAR | Status: DC | PRN
  Administered 2017-09-08 (×3): 5 mg via INTRAVENOUS

## 2017-09-08 MED ORDER — PROPOFOL 10 MG/ML IV BOLUS
INTRAVENOUS | Status: DC | PRN
  Administered 2017-09-08: 50 mg via INTRAVENOUS

## 2017-09-08 MED ORDER — ROCURONIUM BROMIDE 100 MG/10ML IV SOLN
INTRAVENOUS | Status: DC | PRN
  Administered 2017-09-08: 50 mg via INTRAVENOUS

## 2017-09-08 MED ORDER — PHENYLEPHRINE 40 MCG/ML (10ML) SYRINGE FOR IV PUSH (FOR BLOOD PRESSURE SUPPORT)
PREFILLED_SYRINGE | INTRAVENOUS | Status: AC
  Filled 2017-09-08: qty 10

## 2017-09-08 MED ORDER — PHENYLEPHRINE HCL 10 MG/ML IJ SOLN
INTRAMUSCULAR | Status: DC | PRN
  Administered 2017-09-08: 80 ug via INTRAVENOUS
  Administered 2017-09-08: 160 ug via INTRAVENOUS
  Administered 2017-09-08: 120 ug via INTRAVENOUS
  Administered 2017-09-08 (×2): 80 ug via INTRAVENOUS
  Administered 2017-09-08: 120 ug via INTRAVENOUS
  Administered 2017-09-08: 160 ug via INTRAVENOUS

## 2017-09-08 MED ORDER — FENTANYL CITRATE (PF) 250 MCG/5ML IJ SOLN
INTRAMUSCULAR | Status: AC
  Filled 2017-09-08: qty 5

## 2017-09-08 MED ORDER — FENTANYL CITRATE (PF) 100 MCG/2ML IJ SOLN
INTRAMUSCULAR | Status: DC | PRN
  Administered 2017-09-08: 50 ug via INTRAVENOUS

## 2017-09-08 MED ORDER — LIDOCAINE-EPINEPHRINE 1 %-1:100000 IJ SOLN
INTRAMUSCULAR | Status: AC
  Filled 2017-09-08: qty 1

## 2017-09-08 SURGICAL SUPPLY — 43 items
ATTRACTOMAT 16X20 MAGNETIC DRP (DRAPES) IMPLANT
BLADE SURG 15 STRL LF DISP TIS (BLADE) ×1 IMPLANT
BLADE SURG 15 STRL SS (BLADE) ×1
CLEANER TIP ELECTROSURG 2X2 (MISCELLANEOUS) ×2 IMPLANT
COVER SURGICAL LIGHT HANDLE (MISCELLANEOUS) ×2 IMPLANT
DRAPE HALF SHEET 40X57 (DRAPES) IMPLANT
ELECT COATED BLADE 2.86 ST (ELECTRODE) ×2 IMPLANT
ELECT REM PT RETURN 9FT ADLT (ELECTROSURGICAL) ×2
ELECTRODE REM PT RTRN 9FT ADLT (ELECTROSURGICAL) ×1 IMPLANT
GAUZE SPONGE 4X4 16PLY XRAY LF (GAUZE/BANDAGES/DRESSINGS) ×2 IMPLANT
GEL ULTRASOUND 20GR AQUASONIC (MISCELLANEOUS) ×2 IMPLANT
GLOVE BIO SURGEON STRL SZ7.5 (GLOVE) ×2 IMPLANT
GLOVE BIOGEL PI IND STRL 6.5 (GLOVE) ×1 IMPLANT
GLOVE BIOGEL PI IND STRL 7.5 (GLOVE) ×1 IMPLANT
GLOVE BIOGEL PI INDICATOR 6.5 (GLOVE) ×1
GLOVE BIOGEL PI INDICATOR 7.5 (GLOVE) ×1
GLOVE ECLIPSE 6.5 STRL STRAW (GLOVE) ×2 IMPLANT
GLOVE SS BIOGEL STRL SZ 7.5 (GLOVE) ×1 IMPLANT
GLOVE SUPERSENSE BIOGEL SZ 7.5 (GLOVE) ×1
GOWN STRL REUS W/ TWL LRG LVL3 (GOWN DISPOSABLE) ×1 IMPLANT
GOWN STRL REUS W/ TWL XL LVL3 (GOWN DISPOSABLE) ×1 IMPLANT
GOWN STRL REUS W/TWL LRG LVL3 (GOWN DISPOSABLE) ×1
GOWN STRL REUS W/TWL XL LVL3 (GOWN DISPOSABLE) ×1
HOLDER TRACH TUBE VELCRO 19.5 (MISCELLANEOUS) ×2 IMPLANT
KIT BASIN OR (CUSTOM PROCEDURE TRAY) ×2 IMPLANT
KIT ROOM TURNOVER OR (KITS) ×2 IMPLANT
KIT SUCTION CATH 14FR (SUCTIONS) ×2 IMPLANT
NEEDLE HYPO 25GX1X1/2 BEV (NEEDLE) ×2 IMPLANT
NS IRRIG 1000ML POUR BTL (IV SOLUTION) ×2 IMPLANT
PACK EENT II TURBAN DRAPE (CUSTOM PROCEDURE TRAY) ×2 IMPLANT
PAD ARMBOARD 7.5X6 YLW CONV (MISCELLANEOUS) IMPLANT
PENCIL BUTTON HOLSTER BLD 10FT (ELECTRODE) ×2 IMPLANT
SPONGE DRAIN TRACH 4X4 STRL 2S (GAUZE/BANDAGES/DRESSINGS) ×2 IMPLANT
SPONGE INTESTINAL PEANUT (DISPOSABLE) ×4 IMPLANT
SUT SILK 2 0 SH CR/8 (SUTURE) ×2 IMPLANT
SUT SILK 3 0 TIES 10X30 (SUTURE) IMPLANT
SYR 5ML LUER SLIP (SYRINGE) ×2 IMPLANT
SYR CONTROL 10ML LL (SYRINGE) ×2 IMPLANT
TOWEL OR 17X24 6PK STRL BLUE (TOWEL DISPOSABLE) ×2 IMPLANT
TOWEL OR 17X26 10 PK STRL BLUE (TOWEL DISPOSABLE) ×2 IMPLANT
TUBE CONNECTING 12X1/4 (SUCTIONS) ×2 IMPLANT
TUBE TRACH SHILEY  6 DIST  CUF (TUBING) ×2 IMPLANT
TUBE TRACH SHILEY 8 DIST CUF (TUBING) IMPLANT

## 2017-09-08 NOTE — Anesthesia Preprocedure Evaluation (Deleted)
Anesthesia Evaluation  Patient identified by MRN, date of birth, ID band Patient awake    Reviewed: Allergy & Precautions, H&P , NPO status , Patient's Chart, lab work & pertinent test results  Airway Mallampati: Intubated       Dental no notable dental hx. (+) Teeth Intact, Dental Advisory Given   Pulmonary  VDRF   Pulmonary exam normal breath sounds clear to auscultation       Cardiovascular negative cardio ROS   Rhythm:Regular Rate:Normal     Neuro/Psych negative neurological ROS  negative psych ROS   GI/Hepatic negative GI ROS, Neg liver ROS,   Endo/Other  negative endocrine ROS  Renal/GU ESRF and DialysisRenal disease  negative genitourinary   Musculoskeletal   Abdominal   Peds  Hematology negative hematology ROS (+)   Anesthesia Other Findings   Reproductive/Obstetrics negative OB ROS                             Anesthesia Physical Anesthesia Plan  ASA: IV  Anesthesia Plan: General   Post-op Pain Management:    Induction: Intravenous  PONV Risk Score and Plan: 2 and Treatment may vary due to age or medical condition  Airway Management Planned: Tracheostomy  Additional Equipment:   Intra-op Plan:   Post-operative Plan: Post-operative intubation/ventilation  Informed Consent: I have reviewed the patients History and Physical, chart, labs and discussed the procedure including the risks, benefits and alternatives for the proposed anesthesia with the patient or authorized representative who has indicated his/her understanding and acceptance.   Dental advisory given  Plan Discussed with: CRNA  Anesthesia Plan Comments:         Anesthesia Quick Evaluation

## 2017-09-08 NOTE — Progress Notes (Signed)
  [facility]  ROUNDING NOTE   Subjective:  Patient seen at bedside. S/p tracheostomy.   Objective:  Vital signs in last 24 hours:  Temperature: 98.4 Pulse 73 Respirations 17 Blood pressure 116/82  Physical Exam: General: Critically ill appearing  Head: [place]/AT ETT out  Eyes: Anicteric  Neck: Tracheostomy in place  Lungs:  Scattered rhonchi, vent assisted  Heart: S1S2 no rubs  Abdomen:  Soft, nontender, bowel sounds present  Extremities: trace peripheral edema, L BKA  Neurologic: Arousable, hands in restraints  Skin: No lesions  Access: L IJ Permcath    Basic Metabolic Panel: Recent Labs  Lab 09/02/17 0522 09/04/17 0537 09/06/17 0520 09/08/17 0742  NA 129* 131* 133* 133*  K 4.0 3.6 4.4 4.3  CL 90* 94* 95* 95*  CO2 25 23 26 25   GLUCOSE 149* 177* 143* 115*  BUN 59* 52* 40* 67*  CREATININE 6.84* 5.90* 4.98* 6.89*  CALCIUM 9.0 8.7* 8.9 8.9  PHOS 3.8 2.4* 3.2 3.6    Liver Function Tests: Recent Labs  Lab 09/02/17 0522 09/04/17 0537 09/06/17 0520 09/08/17 0742  ALBUMIN 2.3* 2.2* 2.3* 2.3*   No results for input(s): LIPASE, AMYLASE in the last 168 hours. No results for input(s): AMMONIA in the last 168 hours.  CBC: Recent Labs  Lab 09/02/17 0522 09/04/17 0537 09/06/17 0520 09/08/17 0741  WBC 5.8 6.4 6.5 8.0  HGB 7.5* 8.0* 8.4* 7.4*  HCT 24.8* 25.6* 27.4* 24.2*  MCV 79.7 78.8 80.6 80.4  PLT 317 329 348 359    Cardiac Enzymes: No results for input(s): CKTOTAL, CKMB, CKMBINDEX, TROPONINI in the last 168 hours.  BNP: Invalid input(s): POCBNP  CBG: No results for input(s): GLUCAP in the last 168 hours.  Microbiology: No results found for this or any previous visit.  Coagulation Studies: No results for input(s): LABPROT, INR in the last 72 hours.  Urinalysis: No results for input(s): COLORURINE, LABSPEC, PHURINE, GLUCOSEU, HGBUR, BILIRUBINUR, KETONESUR, PROTEINUR, UROBILINOGEN, NITRITE, LEUKOCYTESUR in the last 72 hours.  Invalid  input(s): APPERANCEUR    Imaging: No results found.   Medications:       Assessment/ Plan:  67 y.o. male with a PMHx of end-stage renal disease on hemodialysis, hypertension, diabetes mellitus, coronary disease status post CABG, peripheral vascular disease status post left below the knee amputation, anemia of chronic kidney disease, and secondary hyperparathyroidism, who was admitted to [facility] on 08/23/2017 for ongoing management of respiratory failure, encephalopathy, and end-stage renal disease.   1.  ESRD on HD TTS.    Patient due for hemodialysis again tomorrow.  Patient did have dialysis today.  2.  Anemia of chronic kidney disease.    IMA globin down to 7.4.  If hemoglobin continues to drop consider blood transfusion.  Otherwise maintain Aranesp.  3.  Secondary hyperparathyroidism.    Phosphorus 3.6 and at target.  4.  Acute respiratory failure.  Patient status post tracheostomy today and appears to be tolerating well.   LOS: 0 Michael Moody 2/11/20195:09 PM

## 2017-09-08 NOTE — Anesthesia Postprocedure Evaluation (Signed)
Anesthesia Post Note  Patient: Michael Moody  Procedure(s) Performed: TRACHEOSTOMY (N/A )     Patient location during evaluation: SICU Anesthesia Type: General Level of consciousness: sedated Pain management: pain level controlled Vital Signs Assessment: post-procedure vital signs reviewed and stable Respiratory status: patient on ventilator - see flowsheet for VS (Tracheostomy) Cardiovascular status: stable Postop Assessment: no apparent nausea or vomiting Anesthetic complications: no    Last Vitals: There were no vitals filed for this visit.  Last Pain: There were no vitals filed for this visit.               Edmon Magid,W. EDMOND

## 2017-09-08 NOTE — Anesthesia Preprocedure Evaluation (Signed)
Anesthesia Evaluation  Patient identified by MRN, date of birth, ID band Patient awake    Reviewed: Allergy & Precautions, H&P , NPO status , Patient's Chart, lab work & pertinent test results  Airway Mallampati: Intubated  TM Distance: >3 FB Neck ROM: Full    Dental no notable dental hx. (+) Teeth Intact, Dental Advisory Given   Pulmonary  VDRF   Pulmonary exam normal breath sounds clear to auscultation       Cardiovascular  Rhythm:Regular Rate:Normal  H/O PEA arrest   Neuro/Psych negative neurological ROS  negative psych ROS   GI/Hepatic negative GI ROS, Neg liver ROS,   Endo/Other  negative endocrine ROS  Renal/GU ESRF and DialysisRenal disease  negative genitourinary   Musculoskeletal   Abdominal   Peds  Hematology negative hematology ROS (+)   Anesthesia Other Findings   Reproductive/Obstetrics negative OB ROS                             Anesthesia Physical Anesthesia Plan  ASA: IV  Anesthesia Plan: General   Post-op Pain Management:    Induction: Intravenous  PONV Risk Score and Plan: 2 and Treatment may vary due to age or medical condition  Airway Management Planned: Tracheostomy  Additional Equipment:   Intra-op Plan:   Post-operative Plan: Post-operative intubation/ventilation  Informed Consent: I have reviewed the patients History and Physical, chart, labs and discussed the procedure including the risks, benefits and alternatives for the proposed anesthesia with the patient or authorized representative who has indicated his/her understanding and acceptance.   Dental advisory given  Plan Discussed with: CRNA  Anesthesia Plan Comments:         Anesthesia Quick Evaluation

## 2017-09-08 NOTE — Interval H&P Note (Signed)
History and Physical Interval Note:  09/08/2017 11:31 AM  Michael Moody  has presented today for surgery, with the diagnosis of RESPIRATORY FAILURE  The various methods of treatment have been discussed with the patient and family. After consideration of risks, benefits and other options for treatment, the patient has consented to  Procedure(s): TRACHEOSTOMY (N/A) as a surgical intervention .  The patient's history has been reviewed, patient examined, no change in status, stable for surgery.  I have reviewed the patient's chart and labs.  Questions were answered to the patient's satisfaction.     [name]

## 2017-09-08 NOTE — Brief Op Note (Signed)
09/08/2017  12:51 PM  PATIENT:  Christophe Palladino  67 y.o. male  PRE-OPERATIVE DIAGNOSIS:  respirator failure  POST-OPERATIVE DIAGNOSIS:  respirator failure  PROCEDURE:  Procedure(s): TRACHEOSTOMY (N/A)  # 6 Shiley  SURGEON:  Surgeon(s) and Role:    Rozetta Nunnery, MD - Primary  PHYSICIAN ASSISTANT:   ASSISTANTS: none   ANESTHESIA:   general  EBL:  minimal   BLOOD ADMINISTERED:none  DRAINS: none   LOCAL MEDICATIONS USED:  XYLOCAINE with EPI 4 cc  SPECIMEN:  No Specimen  DISPOSITION OF SPECIMEN:  N/A  COUNTS:  YES  TOURNIQUET:  * No tourniquets in log *  DICTATION: .Other Dictation: Dictation Number [id number]  PLAN OF CARE: Discharge to home after PACU  PATIENT DISPOSITION:  PACU - hemodynamically stable.   Delay start of Pharmacological VTE agent (>24hrs) due to surgical blood loss or risk of bleeding: yes

## 2017-09-08 NOTE — Transfer of Care (Signed)
Immediate Anesthesia Transfer of Care Note  Patient: Michael Moody  Procedure(s) Performed: TRACHEOSTOMY (N/A )  [facility]  Anesthesia Type:General  Level of Consciousness: drowsy  Airway & Oxygen Therapy: Patient placed on Ventilator (see vital sign flow sheet for setting) on trach  Post-op Assessment: Report given to RN and Post -op Vital signs reviewed and stable BP 115/55; spo2 100% HR 82  Post vital signs: Reviewed and stable  Last Vitals: There were no vitals filed for this visit.  Last Pain: There were no vitals filed for this visit.       Complications: No apparent anesthesia complications

## 2017-09-08 NOTE — H&P (Signed)
PREOPERATIVE H&P  Chief Complaint: Respiratory failure  HPI: Michael Moody is a 67 y.o. male who presents for evaluation of acute on chronic respiratory failure. Patient with end stage renal disease on dialysis. Has recently had several cardiac events. Requiring CPR and intubation. Patient subsequently transferred to [facility] on 1/26 intubated on vent. Attempts to wean patient off vent and extubate patient have been unsuccessful and trach was recommended.  No past medical history on file.  Social History   Socioeconomic History  . Marital status: Married    Spouse name: Not on file  . Number of children: Not on file  . Years of education: Not on file  . Highest education level: Not on file  Social Needs  . Financial resource strain: Not on file  . Food insecurity - worry: Not on file  . Food insecurity - inability: Not on file  . Transportation needs - medical: Not on file  . Transportation needs - non-medical: Not on file  Occupational History  . Not on file  Tobacco Use  . Smoking status: Not on file  Substance and Sexual Activity  . Alcohol use: Not on file  . Drug use: Not on file  . Sexual activity: Not on file  Other Topics Concern  . Not on file  Social History Narrative  . Not on file   No family history on file. Allergies not on file Prior to Admission medications   Not on File     Positive ROS: negative  All other systems have been reviewed and were otherwise negative with the exception of those mentioned in the HPI and as above.  Physical Exam: There were no vitals filed for this visit.  General  Patient intubated and not responsive appropriatley Neck: No palpable adenopathy or thyroid nodules. Thick neck with trach midline without masses. Cardiovascular: Regular rate and rhythm, no murmur.  Respiratory: Clear to auscultation   Assessment/Plan: RESPIRATORY FAILURE Plan for Procedure(s): TRACHEOSTOMY   [name], MD 09/08/2017 11:24  AM

## 2017-09-09 ENCOUNTER — Encounter (HOSPITAL_COMMUNITY): Payer: Self-pay | Admitting: Otolaryngology

## 2017-09-09 LAB — RENAL FUNCTION PANEL
Albumin: 2.3 g/dL — ABNORMAL LOW (ref 3.5–5.0)
Anion gap: 10 (ref 5–15)
BUN: 39 mg/dL — ABNORMAL HIGH (ref 6–20)
CO2: 28 mmol/L (ref 22–32)
Calcium: 8.4 mg/dL — ABNORMAL LOW (ref 8.9–10.3)
Chloride: 96 mmol/L — ABNORMAL LOW (ref 101–111)
Creatinine, Ser: 4.78 mg/dL — ABNORMAL HIGH (ref 0.61–1.24)
GFR calc Af Amer: 13 mL/min — ABNORMAL LOW (ref >60)
GFR calc non Af Amer: 11 mL/min — ABNORMAL LOW (ref >60)
Glucose, Bld: 140 mg/dL — ABNORMAL HIGH (ref 65–99)
Phosphorus: 2.7 mg/dL (ref 2.5–4.6)
Potassium: 4 mmol/L (ref 3.5–5.1)
Sodium: 134 mmol/L — ABNORMAL LOW (ref 135–145)

## 2017-09-09 LAB — CBC
HCT: 25.5 % — ABNORMAL LOW (ref 39.0–52.0)
Hemoglobin: 7.7 g/dL — ABNORMAL LOW (ref 13.0–17.0)
MCH: 24.5 pg — ABNORMAL LOW (ref 26.0–34.0)
MCHC: 30.2 g/dL (ref 30.0–36.0)
MCV: 81.2 fL (ref 78.0–100.0)
Platelets: 289 10*3/uL (ref 150–400)
RBC: 3.14 MIL/uL — ABNORMAL LOW (ref 4.22–5.81)
RDW: 19.6 % — ABNORMAL HIGH (ref 11.5–15.5)
WBC: 9 10*3/uL (ref 4.0–10.5)

## 2017-09-09 NOTE — Op Note (Signed)
[name], [name]            ACCOUNT NO.:  [id number]  MEDICAL RECORD NO.:  [id number]  LOCATION:                                 FACILITY:  PHYSICIAN:  Leonides Sake. Lucia Gaskins, M.D. DATE OF BIRTH:  DATE OF PROCEDURE:  09/08/2017 DATE OF DISCHARGE:                              OPERATIVE REPORT   PREOPERATIVE DIAGNOSIS:  Acute on chronic respiratory failure.  POSTOPERATIVE DIAGNOSIS:  Acute on chronic respiratory failure.  OPERATION PERFORMED:  Tracheotomy with a #6 Shiley cuffed tube.  SURGEON:  Leonides Sake. Lucia Gaskins, MD.  ANESTHESIA:  General endotracheal.  COMPLICATIONS:  None.  BRIEF CLINICAL NOTE:  Michael Moody is a 67 year old gentleman who has had a bad history of coronary artery disease, status post previous MI. He also has end-stage renal disease, is on dialysis.  The patient has been on [facility] for several weeks now, intubated on the vent.  Unable to wean the patient and extubate the patient.  It was recommended the patient undergo a tracheotomy and he has taken to the operating room at this time for tracheotomy.  DESCRIPTION OF PROCEDURE:  The patient was brought from the [facility], remained in his bed in the operating room.  He had a kyphotic neck with the neck kind of bowed anteriorly.  He could not extend it.  The area of proposed trach was marked out and prepped with Betadine solution.  A vertical incision was made just above the suprasternal notch.  Dissection was carried down through the subcutaneous tissue with cautery.  Strap muscles were identified and divided vertically and retracted laterally.  The cricoid cartilage was identified and the first 2-3 tracheal rings were identified.  The thyroid isthmus had to be divided and it was recently vascular.  This was controlled with cautery.  It was elected to perform a horizontal tracheotomy between the first and second tracheal rings.  The endotracheal tube was removed  and a #6 cuffed Shiley tube was inserted without any difficulty.  The patient was ventilated well.  The trach was secured with 2-0 silk sutures x4 as well as a Velcro trach collar around the neck.  The patient was subsequently transferred back to [facility].          ______________________________ Leonides Sake. Lucia Gaskins, M.D.    CEN/MEDQ  D:  09/08/2017  T:  09/08/2017  Job:  [id number]

## 2017-09-10 NOTE — Progress Notes (Signed)
  [facility]  ROUNDING NOTE   Subjective:  Patient resting at bedside. His dialysis was performed and completed early this a.m.  Objective:  Vital signs in last 24 hours:  Temperature 98.8 pulse 80 respirations 10 blood pressure 123/57  Physical Exam: General: Critically ill appearing  Head: [place]/AT OM moist  Eyes: Anicteric  Neck: Tracheostomy in place  Lungs:  Scattered rhonchi, vent assisted  Heart: S1S2 no rubs  Abdomen:  Soft, nontender, bowel sounds present  Extremities: trace peripheral edema, L BKA  Neurologic: Awake, hands in restraints  Skin: No lesions  Access: L IJ Permcath    Basic Metabolic Panel: Recent Labs  Lab 09/04/17 0537 09/06/17 0520 09/08/17 0742 09/09/17 0347  NA 131* 133* 133* 134*  K 3.6 4.4 4.3 4.0  CL 94* 95* 95* 96*  CO2 23 26 25 28   GLUCOSE 177* 143* 115* 140*  BUN 52* 40* 67* 39*  CREATININE 5.90* 4.98* 6.89* 4.78*  CALCIUM 8.7* 8.9 8.9 8.4*  PHOS 2.4* 3.2 3.6 2.7    Liver Function Tests: Recent Labs  Lab 09/04/17 0537 09/06/17 0520 09/08/17 0742 09/09/17 0347  ALBUMIN 2.2* 2.3* 2.3* 2.3*   No results for input(s): LIPASE, AMYLASE in the last 168 hours. No results for input(s): AMMONIA in the last 168 hours.  CBC: Recent Labs  Lab 09/04/17 0537 09/06/17 0520 09/08/17 0741 09/09/17 0347  WBC 6.4 6.5 8.0 9.0  HGB 8.0* 8.4* 7.4* 7.7*  HCT 25.6* 27.4* 24.2* 25.5*  MCV 78.8 80.6 80.4 81.2  PLT 329 348 359 289    Cardiac Enzymes: No results for input(s): CKTOTAL, CKMB, CKMBINDEX, TROPONINI in the last 168 hours.  BNP: Invalid input(s): POCBNP  CBG: No results for input(s): GLUCAP in the last 168 hours.  Microbiology: No results found for this or any previous visit.  Coagulation Studies: No results for input(s): LABPROT, INR in the last 72 hours.  Urinalysis: No results for input(s): COLORURINE, LABSPEC, PHURINE, GLUCOSEU, HGBUR, BILIRUBINUR, KETONESUR, PROTEINUR, UROBILINOGEN, NITRITE,  LEUKOCYTESUR in the last 72 hours.  Invalid input(s): APPERANCEUR    Imaging: No results found.   Medications:       Assessment/ Plan:  67 y.o. male with a PMHx of end-stage renal disease on hemodialysis, hypertension, diabetes mellitus, coronary disease status post CABG, peripheral vascular disease status post left below the knee amputation, anemia of chronic kidney disease, and secondary hyperparathyroidism, who was admitted to [facility] on 08/23/2017 for ongoing management of respiratory failure, encephalopathy, and end-stage renal disease.   1.  ESRD on HD TTS.    Patient due for hemodialysis again tomorrow.  Maintain dialysis schedule on Tuesday, Thursday, Saturday.  2.  Anemia of chronic kidney disease.    Hemoglobin currently 7.7.  Maintain the patient on Aranesp 100 mcg subcutaneous weekly.  3.  Secondary hyperparathyroidism.    Phosphorus acceptable at 2.7.  Repeat serum phosphorus tomorrow.  4.  Acute respiratory failure.  Patient with a tracheostomy in place and still on the ventilator.  Continue supportive care.  LOS: 0 Nandi Tonnesen 2/13/20194:58 PM

## 2017-09-11 LAB — RENAL FUNCTION PANEL
Albumin: 2.3 g/dL — ABNORMAL LOW (ref 3.5–5.0)
Anion gap: 11 (ref 5–15)
BUN: 37 mg/dL — ABNORMAL HIGH (ref 6–20)
CALCIUM: 8.7 mg/dL — AB (ref 8.9–10.3)
CO2: 26 mmol/L (ref 22–32)
CREATININE: 4.43 mg/dL — AB (ref 0.61–1.24)
Chloride: 96 mmol/L — ABNORMAL LOW (ref 101–111)
GFR, EST AFRICAN AMERICAN: 15 mL/min — AB (ref >60)
GFR, EST NON AFRICAN AMERICAN: 13 mL/min — AB (ref >60)
Glucose, Bld: 158 mg/dL — ABNORMAL HIGH (ref 65–99)
PHOSPHORUS: 2.3 mg/dL — AB (ref 2.5–4.6)
Potassium: 4 mmol/L (ref 3.5–5.1)
SODIUM: 133 mmol/L — AB (ref 135–145)

## 2017-09-11 LAB — CBC
HCT: 25.9 % — ABNORMAL LOW (ref 39.0–52.0)
Hemoglobin: 7.7 g/dL — ABNORMAL LOW (ref 13.0–17.0)
MCH: 24.1 pg — ABNORMAL LOW (ref 26.0–34.0)
MCHC: 29.7 g/dL — ABNORMAL LOW (ref 30.0–36.0)
MCV: 80.9 fL (ref 78.0–100.0)
PLATELETS: 288 10*3/uL (ref 150–400)
RBC: 3.2 MIL/uL — AB (ref 4.22–5.81)
RDW: 19.3 % — ABNORMAL HIGH (ref 11.5–15.5)
WBC: 8.4 10*3/uL (ref 4.0–10.5)

## 2017-09-12 NOTE — Progress Notes (Signed)
  [facility]  ROUNDING NOTE   Subjective:  Patient remains on the ventilator at this moment. He did complete hemodialysis yesterday. Resting comfortably in bed at the moment.  Objective:  Vital signs in last 24 hours:  Temperature 97 pulse 68 respirations 18 blood pressure 104/57  Physical Exam: General: Critically ill appearing  Head: [place]/AT OM moist  Eyes: Anicteric  Neck: Tracheostomy in place  Lungs:  Scattered rhonchi, vent assisted  Heart: S1S2 no rubs  Abdomen:  Soft, nontender, bowel sounds present  Extremities: trace peripheral edema, L BKA  Neurologic: Awake, hands in restraints  Skin: No lesions  Access: L IJ Permcath    Basic Metabolic Panel: Recent Labs  Lab 09/06/17 0520 09/08/17 0742 09/09/17 0347 09/11/17 0703  NA 133* 133* 134* 133*  K 4.4 4.3 4.0 4.0  CL 95* 95* 96* 96*  CO2 26 25 28 26   GLUCOSE 143* 115* 140* 158*  BUN 40* 67* 39* 37*  CREATININE 4.98* 6.89* 4.78* 4.43*  CALCIUM 8.9 8.9 8.4* 8.7*  PHOS 3.2 3.6 2.7 2.3*    Liver Function Tests: Recent Labs  Lab 09/06/17 0520 09/08/17 0742 09/09/17 0347 09/11/17 0703  ALBUMIN 2.3* 2.3* 2.3* 2.3*   No results for input(s): LIPASE, AMYLASE in the last 168 hours. No results for input(s): AMMONIA in the last 168 hours.  CBC: Recent Labs  Lab 09/06/17 0520 09/08/17 0741 09/09/17 0347 09/11/17 0703  WBC 6.5 8.0 9.0 8.4  HGB 8.4* 7.4* 7.7* 7.7*  HCT 27.4* 24.2* 25.5* 25.9*  MCV 80.6 80.4 81.2 80.9  PLT 348 359 289 288    Cardiac Enzymes: No results for input(s): CKTOTAL, CKMB, CKMBINDEX, TROPONINI in the last 168 hours.  BNP: Invalid input(s): POCBNP  CBG: No results for input(s): GLUCAP in the last 168 hours.  Microbiology: No results found for this or any previous visit.  Coagulation Studies: No results for input(s): LABPROT, INR in the last 72 hours.  Urinalysis: No results for input(s): COLORURINE, LABSPEC, PHURINE, GLUCOSEU, HGBUR, BILIRUBINUR, KETONESUR,  PROTEINUR, UROBILINOGEN, NITRITE, LEUKOCYTESUR in the last 72 hours.  Invalid input(s): APPERANCEUR    Imaging: No results found.   Medications:       Assessment/ Plan:  67 y.o. male with a PMHx of end-stage renal disease on hemodialysis, hypertension, diabetes mellitus, coronary disease status post CABG, peripheral vascular disease status post left below the knee amputation, anemia of chronic kidney disease, and secondary hyperparathyroidism, who was admitted to [facility] on 08/23/2017 for ongoing management of respiratory failure, encephalopathy, and end-stage renal disease.   1.  ESRD on HD TTS.    Patient continues to do well with dialysis.  We will maintain the patient on Tuesday, Thursday, Saturday dialysis schedule.  Therefore he will be due for dialysis again tomorrow.  2.  Anemia of chronic kidney disease.    Hemoglobin currently 7.7.  Maintain the patient on Aranesp 100 mcg subcutaneous weekly.  3.  Secondary hyperparathyroidism.    Phosphorus acceptable at 2.7.  Repeat serum phosphorus tomorrow.  4.  Acute respiratory failure.  Patient with a tracheostomy in place and still on the ventilator.  Continue supportive care.  LOS: 0 Jamayia Croker 2/15/20199:07 AM

## 2017-09-13 LAB — CBC
HEMATOCRIT: 25.2 % — AB (ref 39.0–52.0)
HEMOGLOBIN: 7.6 g/dL — AB (ref 13.0–17.0)
MCH: 24.3 pg — ABNORMAL LOW (ref 26.0–34.0)
MCHC: 30.2 g/dL (ref 30.0–36.0)
MCV: 80.5 fL (ref 78.0–100.0)
Platelets: 272 10*3/uL (ref 150–400)
RBC: 3.13 MIL/uL — ABNORMAL LOW (ref 4.22–5.81)
RDW: 19.2 % — ABNORMAL HIGH (ref 11.5–15.5)
WBC: 7 10*3/uL (ref 4.0–10.5)

## 2017-09-13 LAB — RENAL FUNCTION PANEL
ALBUMIN: 2.1 g/dL — AB (ref 3.5–5.0)
ANION GAP: 13 (ref 5–15)
BUN: 46 mg/dL — ABNORMAL HIGH (ref 6–20)
CALCIUM: 9 mg/dL (ref 8.9–10.3)
CO2: 26 mmol/L (ref 22–32)
Chloride: 93 mmol/L — ABNORMAL LOW (ref 101–111)
Creatinine, Ser: 4.85 mg/dL — ABNORMAL HIGH (ref 0.61–1.24)
GFR, EST AFRICAN AMERICAN: 13 mL/min — AB (ref >60)
GFR, EST NON AFRICAN AMERICAN: 11 mL/min — AB (ref >60)
GLUCOSE: 158 mg/dL — AB (ref 65–99)
PHOSPHORUS: 2.4 mg/dL — AB (ref 2.5–4.6)
POTASSIUM: 3.8 mmol/L (ref 3.5–5.1)
SODIUM: 132 mmol/L — AB (ref 135–145)

## 2017-09-15 NOTE — Progress Notes (Signed)
  [facility]  ROUNDING NOTE   Subjective:  Patient seen at bedside. He has tracheostomy in place with Passy-Muir valve now.    Objective:  Vital signs in last 24 hours:  Temperature 98.1 pulse 76 respirations 20 blood pressure 125/64  Physical Exam: General: Critically ill appearing  Head: [place]/AT OM moist  Eyes: Anicteric  Neck: Tracheostomy in place  Lungs:  Scattered rhonchi, nromal effort  Heart: S1S2 no rubs  Abdomen:  Soft, nontender, bowel sounds present  Extremities: trace peripheral edema, L BKA  Neurologic: Awake, hands in restraints  Skin: No lesions  Access: L IJ Permcath    Basic Metabolic Panel: Recent Labs  Lab 09/09/17 0347 09/11/17 0703 09/13/17 0625  NA 134* 133* 132*  K 4.0 4.0 3.8  CL 96* 96* 93*  CO2 28 26 26   GLUCOSE 140* 158* 158*  BUN 39* 37* 46*  CREATININE 4.78* 4.43* 4.85*  CALCIUM 8.4* 8.7* 9.0  PHOS 2.7 2.3* 2.4*    Liver Function Tests: Recent Labs  Lab 09/09/17 0347 09/11/17 0703 09/13/17 0625  ALBUMIN 2.3* 2.3* 2.1*   No results for input(s): LIPASE, AMYLASE in the last 168 hours. No results for input(s): AMMONIA in the last 168 hours.  CBC: Recent Labs  Lab 09/09/17 0347 09/11/17 0703 09/13/17 0625  WBC 9.0 8.4 7.0  HGB 7.7* 7.7* 7.6*  HCT 25.5* 25.9* 25.2*  MCV 81.2 80.9 80.5  PLT 289 288 272    Cardiac Enzymes: No results for input(s): CKTOTAL, CKMB, CKMBINDEX, TROPONINI in the last 168 hours.  BNP: Invalid input(s): POCBNP  CBG: No results for input(s): GLUCAP in the last 168 hours.  Microbiology: No results found for this or any previous visit.  Coagulation Studies: No results for input(s): LABPROT, INR in the last 72 hours.  Urinalysis: No results for input(s): COLORURINE, LABSPEC, PHURINE, GLUCOSEU, HGBUR, BILIRUBINUR, KETONESUR, PROTEINUR, UROBILINOGEN, NITRITE, LEUKOCYTESUR in the last 72 hours.  Invalid input(s): APPERANCEUR    Imaging: No results found.   Medications:        Assessment/ Plan:  67 y.o. male with a PMHx of end-stage renal disease on hemodialysis, hypertension, diabetes mellitus, coronary disease status post CABG, peripheral vascular disease status post left below the knee amputation, anemia of chronic kidney disease, and secondary hyperparathyroidism, who was admitted to [facility] on 08/23/2017 for ongoing management of respiratory failure, encephalopathy, and end-stage renal disease.   1.  ESRD on HD TTS.    Patient had dialysis on Saturday.  We will plan for dialysis again tomorrow.  2.  Anemia of chronic kidney disease.    Hemoglobin remains low but stable at 7.6.  Maintain the patient on Aranesp.  3.  Secondary hyperparathyroidism.    Check serum phosphorus tomorrow during dialysis.  4.  Acute respiratory failure.  Patient now has Passy-Muir valve in place.  It appears that he is progressing well from a respiratory perspective.  LOS: 0 Mirian Casco 2/18/20194:02 PM

## 2017-09-16 LAB — RENAL FUNCTION PANEL
ANION GAP: 13 (ref 5–15)
Albumin: 2.2 g/dL — ABNORMAL LOW (ref 3.5–5.0)
BUN: 74 mg/dL — ABNORMAL HIGH (ref 6–20)
CO2: 26 mmol/L (ref 22–32)
Calcium: 9.2 mg/dL (ref 8.9–10.3)
Chloride: 93 mmol/L — ABNORMAL LOW (ref 101–111)
Creatinine, Ser: 6.41 mg/dL — ABNORMAL HIGH (ref 0.61–1.24)
GFR calc Af Amer: 9 mL/min — ABNORMAL LOW (ref >60)
GFR calc non Af Amer: 8 mL/min — ABNORMAL LOW (ref >60)
GLUCOSE: 147 mg/dL — AB (ref 65–99)
PHOSPHORUS: 3 mg/dL (ref 2.5–4.6)
POTASSIUM: 3.9 mmol/L (ref 3.5–5.1)
Sodium: 132 mmol/L — ABNORMAL LOW (ref 135–145)

## 2017-09-16 LAB — CBC
HEMATOCRIT: 25.5 % — AB (ref 39.0–52.0)
HEMOGLOBIN: 7.9 g/dL — AB (ref 13.0–17.0)
MCH: 24.6 pg — AB (ref 26.0–34.0)
MCHC: 31 g/dL (ref 30.0–36.0)
MCV: 79.4 fL (ref 78.0–100.0)
Platelets: 312 10*3/uL (ref 150–400)
RBC: 3.21 MIL/uL — ABNORMAL LOW (ref 4.22–5.81)
RDW: 18.7 % — ABNORMAL HIGH (ref 11.5–15.5)
WBC: 8.8 10*3/uL (ref 4.0–10.5)

## 2017-09-17 NOTE — Progress Notes (Signed)
  [facility]  ROUNDING NOTE   Subjective:  Patient had hemodialysis yesterday. Tolerated well. Resting comfortably.   Objective:  Vital signs in last 24 hours:  Temperature 98 pulse 72 respirations 10 blood pressure 128/76  Physical Exam: General: Critically ill appearing  Head: [place]/AT OM moist  Eyes: Anicteric  Neck: Tracheostomy in place  Lungs:  Scattered rhonchi, nromal effort  Heart: S1S2 no rubs  Abdomen:  Soft, nontender, bowel sounds present  Extremities: trace peripheral edema, L BKA  Neurologic: Awake, hands in restraints  Skin: No lesions  Access: L IJ Permcath    Basic Metabolic Panel: Recent Labs  Lab 09/11/17 0703 09/13/17 0625 09/16/17 0710  NA 133* 132* 132*  K 4.0 3.8 3.9  CL 96* 93* 93*  CO2 26 26 26   GLUCOSE 158* 158* 147*  BUN 37* 46* 74*  CREATININE 4.43* 4.85* 6.41*  CALCIUM 8.7* 9.0 9.2  PHOS 2.3* 2.4* 3.0    Liver Function Tests: Recent Labs  Lab 09/11/17 0703 09/13/17 0625 09/16/17 0710  ALBUMIN 2.3* 2.1* 2.2*   No results for input(s): LIPASE, AMYLASE in the last 168 hours. No results for input(s): AMMONIA in the last 168 hours.  CBC: Recent Labs  Lab 09/11/17 0703 09/13/17 0625 09/16/17 0710  WBC 8.4 7.0 8.8  HGB 7.7* 7.6* 7.9*  HCT 25.9* 25.2* 25.5*  MCV 80.9 80.5 79.4  PLT 288 272 312    Cardiac Enzymes: No results for input(s): CKTOTAL, CKMB, CKMBINDEX, TROPONINI in the last 168 hours.  BNP: Invalid input(s): POCBNP  CBG: No results for input(s): GLUCAP in the last 168 hours.  Microbiology: No results found for this or any previous visit.  Coagulation Studies: No results for input(s): LABPROT, INR in the last 72 hours.  Urinalysis: No results for input(s): COLORURINE, LABSPEC, PHURINE, GLUCOSEU, HGBUR, BILIRUBINUR, KETONESUR, PROTEINUR, UROBILINOGEN, NITRITE, LEUKOCYTESUR in the last 72 hours.  Invalid input(s): APPERANCEUR    Imaging: No results found.   Medications:        Assessment/ Plan:  67 y.o. male with a PMHx of end-stage renal disease on hemodialysis, hypertension, diabetes mellitus, coronary disease status post CABG, peripheral vascular disease status post left below the knee amputation, anemia of chronic kidney disease, and secondary hyperparathyroidism, who was admitted to [facility] on 08/23/2017 for ongoing management of respiratory failure, encephalopathy, and end-stage renal disease.   1.  ESRD on HD TTS.    Patient seen at bedside, had HD yesterday, will plan for HD again tomorrow.   2.  Anemia of chronic kidney disease.    Hemoglobin currently 7.9, slightly up, maintain the patient on aranesp.   3.  Secondary hyperparathyroidism.    Phosphorus yesterday was found to be 3.0 and is at target.  Continue to periodically monitor.  4.  Acute respiratory failure.  Patient continues to do well with tracheostomy.   LOS: 0 Ancelmo Hunt 2/20/20193:52 PM

## 2017-09-18 LAB — RENAL FUNCTION PANEL
ANION GAP: 13 (ref 5–15)
Albumin: 2.2 g/dL — ABNORMAL LOW (ref 3.5–5.0)
BUN: 77 mg/dL — ABNORMAL HIGH (ref 6–20)
CHLORIDE: 93 mmol/L — AB (ref 101–111)
CO2: 24 mmol/L (ref 22–32)
Calcium: 9.1 mg/dL (ref 8.9–10.3)
Creatinine, Ser: 6.24 mg/dL — ABNORMAL HIGH (ref 0.61–1.24)
GFR calc Af Amer: 10 mL/min — ABNORMAL LOW (ref >60)
GFR calc non Af Amer: 8 mL/min — ABNORMAL LOW (ref >60)
GLUCOSE: 193 mg/dL — AB (ref 65–99)
POTASSIUM: 4.3 mmol/L (ref 3.5–5.1)
Phosphorus: 2.7 mg/dL (ref 2.5–4.6)
Sodium: 130 mmol/L — ABNORMAL LOW (ref 135–145)

## 2017-09-18 LAB — CBC
HCT: 26.2 % — ABNORMAL LOW (ref 39.0–52.0)
Hemoglobin: 8 g/dL — ABNORMAL LOW (ref 13.0–17.0)
MCH: 24.4 pg — ABNORMAL LOW (ref 26.0–34.0)
MCHC: 30.5 g/dL (ref 30.0–36.0)
MCV: 79.9 fL (ref 78.0–100.0)
Platelets: 285 10*3/uL (ref 150–400)
RBC: 3.28 MIL/uL — AB (ref 4.22–5.81)
RDW: 18.8 % — ABNORMAL HIGH (ref 11.5–15.5)
WBC: 9.1 10*3/uL (ref 4.0–10.5)

## 2017-09-19 NOTE — Progress Notes (Signed)
  [facility]  ROUNDING NOTE   Subjective:  Patient resting comfortably at bedside. He continues dialysis on Tuesday, Thursday, Saturday. Tracheostomy in place.  Objective:  Vital signs in last 24 hours:  Temperature 98 pulse 72 respirations 10 blood pressure 128/76  Physical Exam: General: Critically ill appearing  Head: [place]/AT OM moist  Eyes: Anicteric  Neck: Tracheostomy in place  Lungs:  Scattered rhonchi, nromal effort  Heart: S1S2 no rubs  Abdomen:  Soft, nontender, bowel sounds present  Extremities: trace peripheral edema, L BKA  Neurologic: Awake, hands in restraints  Skin: No lesions  Access: L IJ Permcath    Basic Metabolic Panel: Recent Labs  Lab 09/13/17 0625 09/16/17 0710 09/18/17 0551  NA 132* 132* 130*  K 3.8 3.9 4.3  CL 93* 93* 93*  CO2 26 26 24   GLUCOSE 158* 147* 193*  BUN 46* 74* 77*  CREATININE 4.85* 6.41* 6.24*  CALCIUM 9.0 9.2 9.1  PHOS 2.4* 3.0 2.7    Liver Function Tests: Recent Labs  Lab 09/13/17 0625 09/16/17 0710 09/18/17 0551  ALBUMIN 2.1* 2.2* 2.2*   No results for input(s): LIPASE, AMYLASE in the last 168 hours. No results for input(s): AMMONIA in the last 168 hours.  CBC: Recent Labs  Lab 09/13/17 0625 09/16/17 0710 09/18/17 0551  WBC 7.0 8.8 9.1  HGB 7.6* 7.9* 8.0*  HCT 25.2* 25.5* 26.2*  MCV 80.5 79.4 79.9  PLT 272 312 285    Cardiac Enzymes: No results for input(s): CKTOTAL, CKMB, CKMBINDEX, TROPONINI in the last 168 hours.  BNP: Invalid input(s): POCBNP  CBG: No results for input(s): GLUCAP in the last 168 hours.  Microbiology: No results found for this or any previous visit.  Coagulation Studies: No results for input(s): LABPROT, INR in the last 72 hours.  Urinalysis: No results for input(s): COLORURINE, LABSPEC, PHURINE, GLUCOSEU, HGBUR, BILIRUBINUR, KETONESUR, PROTEINUR, UROBILINOGEN, NITRITE, LEUKOCYTESUR in the last 72 hours.  Invalid input(s): APPERANCEUR    Imaging: No results  found.   Medications:       Assessment/ Plan:  66 y.o. male with a PMHx of end-stage renal disease on hemodialysis, hypertension, diabetes mellitus, coronary disease status post CABG, peripheral vascular disease status post left below the knee amputation, anemia of chronic kidney disease, and secondary hyperparathyroidism, who was admitted to [facility] on 08/23/2017 for ongoing management of respiratory failure, encephalopathy, and end-stage renal disease.   1.  ESRD on HD TTS.    Patient seen and evaluated at bedside.  We will prepare dialysis orders again for tomorrow.  2.  Anemia of chronic kidney disease.    Hemoglobin up slightly to 8.0.  Continue Aranesp and monitoring of CBC.  3.  Secondary hyperparathyroidism.    Phosphorus currently 2.7 and acceptable.  Repeat serum phosphorus tomorrow.  4.  Acute respiratory failure.  Patient has tracheostomy in place.  He appears to be doing well off of the ventilator.   LOS: 0 Miyu Fenderson 2/22/20197:50 AM

## 2017-09-20 LAB — RENAL FUNCTION PANEL
ALBUMIN: 2.1 g/dL — AB (ref 3.5–5.0)
ANION GAP: 14 (ref 5–15)
BUN: 62 mg/dL — AB (ref 6–20)
CALCIUM: 9 mg/dL (ref 8.9–10.3)
CO2: 25 mmol/L (ref 22–32)
Chloride: 92 mmol/L — ABNORMAL LOW (ref 101–111)
Creatinine, Ser: 5.84 mg/dL — ABNORMAL HIGH (ref 0.61–1.24)
GFR calc Af Amer: 10 mL/min — ABNORMAL LOW (ref >60)
GFR calc non Af Amer: 9 mL/min — ABNORMAL LOW (ref >60)
GLUCOSE: 166 mg/dL — AB (ref 65–99)
PHOSPHORUS: 2.3 mg/dL — AB (ref 2.5–4.6)
POTASSIUM: 3.9 mmol/L (ref 3.5–5.1)
Sodium: 131 mmol/L — ABNORMAL LOW (ref 135–145)

## 2017-09-20 LAB — CBC
HEMATOCRIT: 25.1 % — AB (ref 39.0–52.0)
Hemoglobin: 7.7 g/dL — ABNORMAL LOW (ref 13.0–17.0)
MCH: 24.4 pg — ABNORMAL LOW (ref 26.0–34.0)
MCHC: 30.7 g/dL (ref 30.0–36.0)
MCV: 79.4 fL (ref 78.0–100.0)
PLATELETS: 331 10*3/uL (ref 150–400)
RBC: 3.16 MIL/uL — ABNORMAL LOW (ref 4.22–5.81)
RDW: 19 % — AB (ref 11.5–15.5)
WBC: 9.5 10*3/uL (ref 4.0–10.5)

## 2017-09-22 LAB — RENAL FUNCTION PANEL
Albumin: 2.2 g/dL — ABNORMAL LOW (ref 3.5–5.0)
Anion gap: 12 (ref 5–15)
BUN: 59 mg/dL — ABNORMAL HIGH (ref 6–20)
CO2: 27 mmol/L (ref 22–32)
Calcium: 9.2 mg/dL (ref 8.9–10.3)
Chloride: 91 mmol/L — ABNORMAL LOW (ref 101–111)
Creatinine, Ser: 5.84 mg/dL — ABNORMAL HIGH (ref 0.61–1.24)
GFR calc Af Amer: 10 mL/min — ABNORMAL LOW (ref >60)
GFR calc non Af Amer: 9 mL/min — ABNORMAL LOW (ref >60)
Glucose, Bld: 173 mg/dL — ABNORMAL HIGH (ref 65–99)
Phosphorus: 2.2 mg/dL — ABNORMAL LOW (ref 2.5–4.6)
Potassium: 4.1 mmol/L (ref 3.5–5.1)
Sodium: 130 mmol/L — ABNORMAL LOW (ref 135–145)

## 2017-09-22 LAB — CBC
HEMATOCRIT: 26.2 % — AB (ref 39.0–52.0)
Hemoglobin: 8 g/dL — ABNORMAL LOW (ref 13.0–17.0)
MCH: 24 pg — ABNORMAL LOW (ref 26.0–34.0)
MCHC: 30.5 g/dL (ref 30.0–36.0)
MCV: 78.4 fL (ref 78.0–100.0)
PLATELETS: 366 10*3/uL (ref 150–400)
RBC: 3.34 MIL/uL — ABNORMAL LOW (ref 4.22–5.81)
RDW: 18.7 % — AB (ref 11.5–15.5)
WBC: 12 10*3/uL — ABNORMAL HIGH (ref 4.0–10.5)

## 2017-09-22 NOTE — Progress Notes (Signed)
  [facility]  ROUNDING NOTE   Subjective:  Patient continues dialysis on Tuesday, Thursday, Saturday. Still has tracheostomy in place.  Objective:  Vital signs in last 24 hours:  Temperature 98.9 pulse 82 respirations 18 blood pressure 140/74  Physical Exam: General: Critically ill appearing  Head: [place]/AT OM moist  Eyes: Anicteric  Neck: Tracheostomy in place  Lungs:  Scattered rhonchi, nromal effort  Heart: S1S2 no rubs  Abdomen:  Soft, nontender, bowel sounds present  Extremities: trace peripheral edema, L BKA  Neurologic: Awake, hands in restraints  Skin: No lesions  Access: L IJ Permcath    Basic Metabolic Panel: Recent Labs  Lab 09/16/17 0710 09/18/17 0551 09/20/17 0629 09/22/17 0914  NA 132* 130* 131* 130*  K 3.9 4.3 3.9 4.1  CL 93* 93* 92* 91*  CO2 26 24 25 27   GLUCOSE 147* 193* 166* 173*  BUN 74* 77* 62* 59*  CREATININE 6.41* 6.24* 5.84* 5.84*  CALCIUM 9.2 9.1 9.0 9.2  PHOS 3.0 2.7 2.3* 2.2*    Liver Function Tests: Recent Labs  Lab 09/16/17 0710 09/18/17 0551 09/20/17 0629 09/22/17 0914  ALBUMIN 2.2* 2.2* 2.1* 2.2*   No results for input(s): LIPASE, AMYLASE in the last 168 hours. No results for input(s): AMMONIA in the last 168 hours.  CBC: Recent Labs  Lab 09/16/17 0710 09/18/17 0551 09/20/17 0629 09/22/17 0914  WBC 8.8 9.1 9.5 12.0*  HGB 7.9* 8.0* 7.7* 8.0*  HCT 25.5* 26.2* 25.1* 26.2*  MCV 79.4 79.9 79.4 78.4  PLT 312 285 331 366    Cardiac Enzymes: No results for input(s): CKTOTAL, CKMB, CKMBINDEX, TROPONINI in the last 168 hours.  BNP: Invalid input(s): POCBNP  CBG: No results for input(s): GLUCAP in the last 168 hours.  Microbiology: No results found for this or any previous visit.  Coagulation Studies: No results for input(s): LABPROT, INR in the last 72 hours.  Urinalysis: No results for input(s): COLORURINE, LABSPEC, PHURINE, GLUCOSEU, HGBUR, BILIRUBINUR, KETONESUR, PROTEINUR, UROBILINOGEN, NITRITE,  LEUKOCYTESUR in the last 72 hours.  Invalid input(s): APPERANCEUR    Imaging: No results found.   Medications:       Assessment/ Plan:  67 y.o. male with a PMHx of end-stage renal disease on hemodialysis, hypertension, diabetes mellitus, coronary disease status post CABG, peripheral vascular disease status post left below the knee amputation, anemia of chronic kidney disease, and secondary hyperparathyroidism, who was admitted to [facility] on 08/23/2017 for ongoing management of respiratory failure, encephalopathy, and end-stage renal disease.   1.  ESRD on HD TTS.    Patient due for hemodialysis tomorrow.  Ultrafiltration target 1.5 kg.  2.  Anemia of chronic kidney disease.    Hemoglobin currently 8.0.  Patient to be converted to Epogen.  3.  Secondary hyperparathyroidism.    Phosphorus currently 2.2.  We plan to recheck this tomorrow.  4.  Acute respiratory failure.  Patient not currently on the ventilator.  His tracheostomy appears functional.  Continue to monitor clinically.  LOS: 0 Michael Moody 2/25/20194:33 PM

## 2017-09-23 LAB — RENAL FUNCTION PANEL
ANION GAP: 11 (ref 5–15)
Albumin: 1.9 g/dL — ABNORMAL LOW (ref 3.5–5.0)
BUN: 37 mg/dL — ABNORMAL HIGH (ref 6–20)
CHLORIDE: 96 mmol/L — AB (ref 101–111)
CO2: 27 mmol/L (ref 22–32)
Calcium: 8.7 mg/dL — ABNORMAL LOW (ref 8.9–10.3)
Creatinine, Ser: 4.16 mg/dL — ABNORMAL HIGH (ref 0.61–1.24)
GFR calc Af Amer: 16 mL/min — ABNORMAL LOW (ref >60)
GFR calc non Af Amer: 14 mL/min — ABNORMAL LOW (ref >60)
GLUCOSE: 161 mg/dL — AB (ref 65–99)
POTASSIUM: 3.8 mmol/L (ref 3.5–5.1)
Phosphorus: 2 mg/dL — ABNORMAL LOW (ref 2.5–4.6)
SODIUM: 134 mmol/L — AB (ref 135–145)

## 2017-09-23 LAB — CBC
HEMATOCRIT: 26.2 % — AB (ref 39.0–52.0)
HEMOGLOBIN: 7.9 g/dL — AB (ref 13.0–17.0)
MCH: 23.9 pg — ABNORMAL LOW (ref 26.0–34.0)
MCHC: 30.2 g/dL (ref 30.0–36.0)
MCV: 79.2 fL (ref 78.0–100.0)
Platelets: 333 10*3/uL (ref 150–400)
RBC: 3.31 MIL/uL — ABNORMAL LOW (ref 4.22–5.81)
RDW: 18.8 % — ABNORMAL HIGH (ref 11.5–15.5)
WBC: 12.1 10*3/uL — ABNORMAL HIGH (ref 4.0–10.5)

## 2017-09-24 NOTE — Progress Notes (Signed)
  [facility]  ROUNDING NOTE   Subjective:  Patient is awake and alert but does not consistently follow commands. Continues dialysis on Tuesday, Thursday, Saturday.  Objective:  Vital signs in last 24 hours:  Temperature 98.4 pulse 79 respirations 21 blood pressure 121/60  Physical Exam: General: No acute distress  Head: [place]/AT OM moist  Eyes: Anicteric  Neck: Tracheostomy in place  Lungs:  Scattered rhonchi, nromal effort  Heart: S1S2 no rubs  Abdomen:  Soft, nontender, bowel sounds present  Extremities: trace peripheral edema, L BKA  Neurologic: Awake, hands in restraints  Skin: No lesions  Access: L IJ Permcath    Basic Metabolic Panel: Recent Labs  Lab 09/18/17 0551 09/20/17 0629 09/22/17 0914 09/23/17 0606  NA 130* 131* 130* 134*  K 4.3 3.9 4.1 3.8  CL 93* 92* 91* 96*  CO2 24 25 27 27   GLUCOSE 193* 166* 173* 161*  BUN 77* 62* 59* 37*  CREATININE 6.24* 5.84* 5.84* 4.16*  CALCIUM 9.1 9.0 9.2 8.7*  PHOS 2.7 2.3* 2.2* 2.0*    Liver Function Tests: Recent Labs  Lab 09/18/17 0551 09/20/17 0629 09/22/17 0914 09/23/17 0606  ALBUMIN 2.2* 2.1* 2.2* 1.9*   No results for input(s): LIPASE, AMYLASE in the last 168 hours. No results for input(s): AMMONIA in the last 168 hours.  CBC: Recent Labs  Lab 09/18/17 0551 09/20/17 0629 09/22/17 0914 09/23/17 0606  WBC 9.1 9.5 12.0* 12.1*  HGB 8.0* 7.7* 8.0* 7.9*  HCT 26.2* 25.1* 26.2* 26.2*  MCV 79.9 79.4 78.4 79.2  PLT 285 331 366 333    Cardiac Enzymes: No results for input(s): CKTOTAL, CKMB, CKMBINDEX, TROPONINI in the last 168 hours.  BNP: Invalid input(s): POCBNP  CBG: No results for input(s): GLUCAP in the last 168 hours.  Microbiology: No results found for this or any previous visit.  Coagulation Studies: No results for input(s): LABPROT, INR in the last 72 hours.  Urinalysis: No results for input(s): COLORURINE, LABSPEC, PHURINE, GLUCOSEU, HGBUR, BILIRUBINUR, KETONESUR, PROTEINUR,  UROBILINOGEN, NITRITE, LEUKOCYTESUR in the last 72 hours.  Invalid input(s): APPERANCEUR    Imaging: No results found.   Medications:       Assessment/ Plan:  67 y.o. male with a PMHx of end-stage renal disease on hemodialysis, hypertension, diabetes mellitus, coronary disease status post CABG, peripheral vascular disease status post left below the knee amputation, anemia of chronic kidney disease, and secondary hyperparathyroidism, who was admitted to [facility] on 08/23/2017 for ongoing management of respiratory failure, encephalopathy, and end-stage renal disease.   1.  ESRD on HD TTS.    Patient due for dialysis again tomorrow.  We will prepare orders.  Ultrafiltration target 1.5 kg.  2.  Anemia of chronic kidney disease.    Hemoglobin relatively stable at 7.9.  Maintain the patient on Epogen.  3.  Secondary hyperparathyroidism.    Phosphorus 2.0 at last check.  Recheck on Friday.  4.  Acute respiratory failure.  Tracheostomy appears to be functioning well.  Patient currently on trach collar.  Continue supportive care.  LOS: 0 Lonnie Rosado 2/27/20194:22 PM

## 2017-09-25 LAB — BLOOD GAS, ARTERIAL
ACID-BASE EXCESS: 2.6 mmol/L — AB (ref 0.0–2.0)
Bicarbonate: 26.9 mmol/L (ref 20.0–28.0)
FIO2: 40
MECHVT: 500 mL
O2 SAT: 97.9 %
PEEP/CPAP: 5 cmH2O
PH ART: 7.411 (ref 7.350–7.450)
Patient temperature: 98.6
RATE: 16 resp/min
pCO2 arterial: 43.2 mmHg (ref 32.0–48.0)
pO2, Arterial: 102 mmHg (ref 83.0–108.0)

## 2017-09-25 MED FILL — Medication: Qty: 1 | Status: AC

## 2017-09-26 LAB — RENAL FUNCTION PANEL
Albumin: 2.1 g/dL — ABNORMAL LOW (ref 3.5–5.0)
Anion gap: 13 (ref 5–15)
BUN: 65 mg/dL — AB (ref 6–20)
CALCIUM: 8.8 mg/dL — AB (ref 8.9–10.3)
CHLORIDE: 94 mmol/L — AB (ref 101–111)
CO2: 23 mmol/L (ref 22–32)
CREATININE: 5.55 mg/dL — AB (ref 0.61–1.24)
GFR, EST AFRICAN AMERICAN: 11 mL/min — AB (ref >60)
GFR, EST NON AFRICAN AMERICAN: 10 mL/min — AB (ref >60)
Glucose, Bld: 180 mg/dL — ABNORMAL HIGH (ref 65–99)
Phosphorus: 1.9 mg/dL — ABNORMAL LOW (ref 2.5–4.6)
Potassium: 4.1 mmol/L (ref 3.5–5.1)
SODIUM: 130 mmol/L — AB (ref 135–145)

## 2017-09-26 LAB — CBC
HCT: 24 % — ABNORMAL LOW (ref 39.0–52.0)
Hemoglobin: 7.4 g/dL — ABNORMAL LOW (ref 13.0–17.0)
MCH: 24.2 pg — AB (ref 26.0–34.0)
MCHC: 30.8 g/dL (ref 30.0–36.0)
MCV: 78.4 fL (ref 78.0–100.0)
PLATELETS: 364 10*3/uL (ref 150–400)
RBC: 3.06 MIL/uL — AB (ref 4.22–5.81)
RDW: 19.2 % — AB (ref 11.5–15.5)
WBC: 10.8 10*3/uL — AB (ref 4.0–10.5)

## 2017-09-26 LAB — TROPONIN I

## 2017-09-26 NOTE — Progress Notes (Signed)
  [facility]  ROUNDING NOTE   Subjective:  Patient seen at bedside. Resting comfortably in bed.   Objective:  Vital signs in last 24 hours:  Temperature 98.5 pulse 80 respirations 18 blood pressure 120/75  Physical Exam: General: No acute distress  Head: [place]/AT OM moist  Eyes: Anicteric  Neck: Tracheostomy in place  Lungs:  Scattered rhonchi, nromal effort  Heart: S1S2 no rubs  Abdomen:  Soft, nontender, bowel sounds present  Extremities: trace peripheral edema, L BKA  Neurologic: Awake, hands in restraints  Skin: No lesions  Access: L IJ Permcath    Basic Metabolic Panel: Recent Labs  Lab 09/20/17 0629 09/22/17 0914 09/23/17 0606 09/26/17 0559  NA 131* 130* 134* 130*  K 3.9 4.1 3.8 4.1  CL 92* 91* 96* 94*  CO2 25 27 27 23   GLUCOSE 166* 173* 161* 180*  BUN 62* 59* 37* 65*  CREATININE 5.84* 5.84* 4.16* 5.55*  CALCIUM 9.0 9.2 8.7* 8.8*  PHOS 2.3* 2.2* 2.0* 1.9*    Liver Function Tests: Recent Labs  Lab 09/20/17 0629 09/22/17 0914 09/23/17 0606 09/26/17 0559  ALBUMIN 2.1* 2.2* 1.9* 2.1*   No results for input(s): LIPASE, AMYLASE in the last 168 hours. No results for input(s): AMMONIA in the last 168 hours.  CBC: Recent Labs  Lab 09/20/17 0629 09/22/17 0914 09/23/17 0606 09/26/17 0559  WBC 9.5 12.0* 12.1* 10.8*  HGB 7.7* 8.0* 7.9* 7.4*  HCT 25.1* 26.2* 26.2* 24.0*  MCV 79.4 78.4 79.2 78.4  PLT 331 366 333 364    Cardiac Enzymes: Recent Labs  Lab 09/26/17 0559  TROPONINI <0.03    BNP: Invalid input(s): POCBNP  CBG: No results for input(s): GLUCAP in the last 168 hours.  Microbiology: No results found for this or any previous visit.  Coagulation Studies: No results for input(s): LABPROT, INR in the last 72 hours.  Urinalysis: No results for input(s): COLORURINE, LABSPEC, PHURINE, GLUCOSEU, HGBUR, BILIRUBINUR, KETONESUR, PROTEINUR, UROBILINOGEN, NITRITE, LEUKOCYTESUR in the last 72 hours.  Invalid input(s): APPERANCEUR     Imaging: No results found.   Medications:       Assessment/ Plan:  67 y.o. male with a PMHx of end-stage renal disease on hemodialysis, hypertension, diabetes mellitus, coronary disease status post CABG, peripheral vascular disease status post left below the knee amputation, anemia of chronic kidney disease, and secondary hyperparathyroidism, who was admitted to [facility] on 08/23/2017 for ongoing management of respiratory failure, encephalopathy, and end-stage renal disease.   1.  ESRD on HD MWF.    Patient due for dialysis today.  Orders have been prepared.  2.  Anemia of chronic kidney disease.    Hemoglobin down a bit to 7.4.  Maintain the patient on erythropoietin stimulating agent.  3.  Secondary hyperparathyroidism.    Phosphorus has been low normal recently and currently 1.9.  We will go ahead and discontinue calcium acetate.  4.  Acute respiratory failure.  Trach functioning well.    LOS: 0 Maya Scholer 3/1/20198:45 AM

## 2017-09-29 LAB — CBC
HEMATOCRIT: 23.3 % — AB (ref 39.0–52.0)
Hemoglobin: 7.3 g/dL — ABNORMAL LOW (ref 13.0–17.0)
MCH: 24.5 pg — ABNORMAL LOW (ref 26.0–34.0)
MCHC: 31.3 g/dL (ref 30.0–36.0)
MCV: 78.2 fL (ref 78.0–100.0)
Platelets: 388 10*3/uL (ref 150–400)
RBC: 2.98 MIL/uL — ABNORMAL LOW (ref 4.22–5.81)
RDW: 19.2 % — AB (ref 11.5–15.5)
WBC: 9.8 10*3/uL (ref 4.0–10.5)

## 2017-09-29 LAB — RENAL FUNCTION PANEL
Albumin: 1.9 g/dL — ABNORMAL LOW (ref 3.5–5.0)
Anion gap: 13 (ref 5–15)
BUN: 75 mg/dL — AB (ref 6–20)
CHLORIDE: 97 mmol/L — AB (ref 101–111)
CO2: 22 mmol/L (ref 22–32)
Calcium: 8.6 mg/dL — ABNORMAL LOW (ref 8.9–10.3)
Creatinine, Ser: 6.63 mg/dL — ABNORMAL HIGH (ref 0.61–1.24)
GFR calc Af Amer: 9 mL/min — ABNORMAL LOW (ref >60)
GFR, EST NON AFRICAN AMERICAN: 8 mL/min — AB (ref >60)
Glucose, Bld: 146 mg/dL — ABNORMAL HIGH (ref 65–99)
POTASSIUM: 4.3 mmol/L (ref 3.5–5.1)
Phosphorus: 3.2 mg/dL (ref 2.5–4.6)
Sodium: 132 mmol/L — ABNORMAL LOW (ref 135–145)

## 2017-09-29 NOTE — Progress Notes (Signed)
  [facility]  ROUNDING NOTE   Subjective:  Patient seen and evaluated during hemodialysis. He appears to be tolerating well. Catheter not flowing optimally however.   Objective:  Vital signs in last 24 hours:  Pulse 80 respirations 16 blood pressure 115/61  Physical Exam: General: No acute distress  Head: [place]/AT OM moist  Eyes: Anicteric  Neck: Tracheostomy in place  Lungs:  Scattered rhonchi, nromal effort  Heart: S1S2 no rubs  Abdomen:  Soft, nontender, bowel sounds present  Extremities: trace peripheral edema, L BKA  Neurologic: Awake, hands in restraints  Skin: No lesions  Access: L IJ Permcath    Basic Metabolic Panel: Recent Labs  Lab 09/23/17 0606 09/26/17 0559 09/29/17 0949  NA 134* 130* 132*  K 3.8 4.1 4.3  CL 96* 94* 97*  CO2 27 23 22   GLUCOSE 161* 180* 146*  BUN 37* 65* 75*  CREATININE 4.16* 5.55* 6.63*  CALCIUM 8.7* 8.8* 8.6*  PHOS 2.0* 1.9* 3.2    Liver Function Tests: Recent Labs  Lab 09/23/17 0606 09/26/17 0559 09/29/17 0949  ALBUMIN 1.9* 2.1* 1.9*   No results for input(s): LIPASE, AMYLASE in the last 168 hours. No results for input(s): AMMONIA in the last 168 hours.  CBC: Recent Labs  Lab 09/23/17 0606 09/26/17 0559 09/29/17 0949  WBC 12.1* 10.8* 9.8  HGB 7.9* 7.4* 7.3*  HCT 26.2* 24.0* 23.3*  MCV 79.2 78.4 78.2  PLT 333 364 388    Cardiac Enzymes: Recent Labs  Lab 09/26/17 0559  TROPONINI <0.03    BNP: Invalid input(s): POCBNP  CBG: No results for input(s): GLUCAP in the last 168 hours.  Microbiology: No results found for this or any previous visit.  Coagulation Studies: No results for input(s): LABPROT, INR in the last 72 hours.  Urinalysis: No results for input(s): COLORURINE, LABSPEC, PHURINE, GLUCOSEU, HGBUR, BILIRUBINUR, KETONESUR, PROTEINUR, UROBILINOGEN, NITRITE, LEUKOCYTESUR in the last 72 hours.  Invalid input(s): APPERANCEUR    Imaging: No results found.   Medications:        Assessment/ Plan:  67 y.o. male with a PMHx of end-stage renal disease on hemodialysis, hypertension, diabetes mellitus, coronary disease status post CABG, peripheral vascular disease status post left below the knee amputation, anemia of chronic kidney disease, and secondary hyperparathyroidism, who was admitted to [facility] on 08/23/2017 for ongoing management of respiratory failure, encephalopathy, and end-stage renal disease.   1.  ESRD on HD MWF.    Patient seen and evaluated during hemodialysis today.  Catheter not functioning optimally.  If we continue to have catheter related issues we may need to switch out PermCath.  2.  Anemia of chronic kidney disease.    Hemoglobin currently 7.3.  Maintain the patient on erythropoietin stimulating agent during dialysis treatments.  3.  Secondary hyperparathyroidism.    Phosphorus up to 3.2 post discontinuation of calcium acetate.  4.  Acute respiratory failure.  Continues to breathe comfortably at the moment via tracheostomy.  Continue to monitor.   LOS: 0 Allisyn Kunz 3/4/20194:24 PM

## 2017-09-30 ENCOUNTER — Other Ambulatory Visit: Payer: Self-pay

## 2017-10-01 LAB — CBC
HEMATOCRIT: 25 % — AB (ref 39.0–52.0)
HEMOGLOBIN: 7.5 g/dL — AB (ref 13.0–17.0)
MCH: 23.8 pg — AB (ref 26.0–34.0)
MCHC: 30 g/dL (ref 30.0–36.0)
MCV: 79.4 fL (ref 78.0–100.0)
Platelets: 340 10*3/uL (ref 150–400)
RBC: 3.15 MIL/uL — AB (ref 4.22–5.81)
RDW: 18.9 % — ABNORMAL HIGH (ref 11.5–15.5)
WBC: 10.3 10*3/uL (ref 4.0–10.5)

## 2017-10-01 LAB — RENAL FUNCTION PANEL
ANION GAP: 12 (ref 5–15)
Albumin: 2.1 g/dL — ABNORMAL LOW (ref 3.5–5.0)
BUN: 65 mg/dL — ABNORMAL HIGH (ref 6–20)
CHLORIDE: 99 mmol/L — AB (ref 101–111)
CO2: 26 mmol/L (ref 22–32)
Calcium: 8.9 mg/dL (ref 8.9–10.3)
Creatinine, Ser: 6 mg/dL — ABNORMAL HIGH (ref 0.61–1.24)
GFR calc non Af Amer: 9 mL/min — ABNORMAL LOW (ref >60)
GFR, EST AFRICAN AMERICAN: 10 mL/min — AB (ref >60)
Glucose, Bld: 134 mg/dL — ABNORMAL HIGH (ref 65–99)
POTASSIUM: 3.9 mmol/L (ref 3.5–5.1)
Phosphorus: 3.6 mg/dL (ref 2.5–4.6)
Sodium: 137 mmol/L (ref 135–145)

## 2017-10-01 NOTE — Progress Notes (Signed)
  [facility]  ROUNDING NOTE   Subjective:  Patient resting comfortably in bed. He will be due for dialysis later tonight.  Objective:  Vital signs in last 24 hours:  Temperature 98.5 pulse 73 respirations 20, blood pressure 117/56  Physical Exam: General: No acute distress  Head: [place]/AT OM moist  Eyes: Anicteric  Neck: Tracheostomy in place  Lungs:  Scattered rhonchi, nromal effort  Heart: S1S2 no rubs  Abdomen:  Soft, nontender, bowel sounds present  Extremities: trace peripheral edema, L BKA  Neurologic: Awake, hands in restraints  Skin: No lesions  Access: L IJ Permcath    Basic Metabolic Panel: Recent Labs  Lab 09/26/17 0559 09/29/17 0949 10/01/17 0842  NA 130* 132* 137  K 4.1 4.3 3.9  CL 94* 97* 99*  CO2 23 22 26   GLUCOSE 180* 146* 134*  BUN 65* 75* 65*  CREATININE 5.55* 6.63* 6.00*  CALCIUM 8.8* 8.6* 8.9  PHOS 1.9* 3.2 3.6    Liver Function Tests: Recent Labs  Lab 09/26/17 0559 09/29/17 0949 10/01/17 0842  ALBUMIN 2.1* 1.9* 2.1*   No results for input(s): LIPASE, AMYLASE in the last 168 hours. No results for input(s): AMMONIA in the last 168 hours.  CBC: Recent Labs  Lab 09/26/17 0559 09/29/17 0949 10/01/17 0842  WBC 10.8* 9.8 10.3  HGB 7.4* 7.3* 7.5*  HCT 24.0* 23.3* 25.0*  MCV 78.4 78.2 79.4  PLT 364 388 340    Cardiac Enzymes: Recent Labs  Lab 09/26/17 0559  TROPONINI <0.03    BNP: Invalid input(s): POCBNP  CBG: No results for input(s): GLUCAP in the last 168 hours.  Microbiology: No results found for this or any previous visit.  Coagulation Studies: No results for input(s): LABPROT, INR in the last 72 hours.  Urinalysis: No results for input(s): COLORURINE, LABSPEC, PHURINE, GLUCOSEU, HGBUR, BILIRUBINUR, KETONESUR, PROTEINUR, UROBILINOGEN, NITRITE, LEUKOCYTESUR in the last 72 hours.  Invalid input(s): APPERANCEUR    Imaging: No results found.   Medications:       Assessment/ Plan:  67 y.o. male  with a PMHx of end-stage renal disease on hemodialysis, hypertension, diabetes mellitus, coronary disease status post CABG, peripheral vascular disease status post left below the knee amputation, anemia of chronic kidney disease, and secondary hyperparathyroidism, who was admitted to [facility] on 08/23/2017 for ongoing management of respiratory failure, encephalopathy, and end-stage renal disease.   1.  ESRD on HD MWF.    Patient due for hemodialysis later tonight. Orders have been prepared. Thereafter next dialysis on Friday.  2.  Anemia of chronic kidney disease.    Hemoglobin currently 7.5. Maintain the patient on retacrit.   3.  Secondary hyperparathyroidism.    Phosphorus currently 3.6. Keep the patient off of calcium acetate.  4.  Acute respiratory failure.  Tracheostomy continues to be functioning well.   LOS: 0 Michael Moody 3/6/20196:46 PM

## 2017-10-02 LAB — RENAL FUNCTION PANEL
Albumin: 2.2 g/dL — ABNORMAL LOW (ref 3.5–5.0)
Anion gap: 10 (ref 5–15)
BUN: 77 mg/dL — ABNORMAL HIGH (ref 6–20)
CALCIUM: 8.6 mg/dL — AB (ref 8.9–10.3)
CHLORIDE: 98 mmol/L — AB (ref 101–111)
CO2: 27 mmol/L (ref 22–32)
CREATININE: 6.78 mg/dL — AB (ref 0.61–1.24)
GFR calc non Af Amer: 7 mL/min — ABNORMAL LOW (ref >60)
GFR, EST AFRICAN AMERICAN: 9 mL/min — AB (ref >60)
Glucose, Bld: 152 mg/dL — ABNORMAL HIGH (ref 65–99)
Phosphorus: 3.8 mg/dL (ref 2.5–4.6)
Potassium: 4.1 mmol/L (ref 3.5–5.1)
SODIUM: 135 mmol/L (ref 135–145)

## 2017-10-02 LAB — CBC
HCT: 25.5 % — ABNORMAL LOW (ref 39.0–52.0)
Hemoglobin: 7.7 g/dL — ABNORMAL LOW (ref 13.0–17.0)
MCH: 24.1 pg — AB (ref 26.0–34.0)
MCHC: 30.2 g/dL (ref 30.0–36.0)
MCV: 79.9 fL (ref 78.0–100.0)
PLATELETS: 408 10*3/uL — AB (ref 150–400)
RBC: 3.19 MIL/uL — ABNORMAL LOW (ref 4.22–5.81)
RDW: 19.2 % — ABNORMAL HIGH (ref 11.5–15.5)
WBC: 10.2 10*3/uL (ref 4.0–10.5)

## 2017-10-03 LAB — RENAL FUNCTION PANEL
ALBUMIN: 2.2 g/dL — AB (ref 3.5–5.0)
Anion gap: 10 (ref 5–15)
BUN: 45 mg/dL — AB (ref 6–20)
CALCIUM: 8.6 mg/dL — AB (ref 8.9–10.3)
CO2: 28 mmol/L (ref 22–32)
Chloride: 98 mmol/L — ABNORMAL LOW (ref 101–111)
Creatinine, Ser: 4.68 mg/dL — ABNORMAL HIGH (ref 0.61–1.24)
GFR calc Af Amer: 14 mL/min — ABNORMAL LOW (ref >60)
GFR calc non Af Amer: 12 mL/min — ABNORMAL LOW (ref >60)
GLUCOSE: 132 mg/dL — AB (ref 65–99)
PHOSPHORUS: 2.8 mg/dL (ref 2.5–4.6)
POTASSIUM: 3.9 mmol/L (ref 3.5–5.1)
SODIUM: 136 mmol/L (ref 135–145)

## 2017-10-03 LAB — CBC
HCT: 24.6 % — ABNORMAL LOW (ref 39.0–52.0)
HEMOGLOBIN: 7.4 g/dL — AB (ref 13.0–17.0)
MCH: 24.1 pg — ABNORMAL LOW (ref 26.0–34.0)
MCHC: 30.1 g/dL (ref 30.0–36.0)
MCV: 80.1 fL (ref 78.0–100.0)
PLATELETS: 363 10*3/uL (ref 150–400)
RBC: 3.07 MIL/uL — AB (ref 4.22–5.81)
RDW: 19.4 % — ABNORMAL HIGH (ref 11.5–15.5)
WBC: 9.3 10*3/uL (ref 4.0–10.5)

## 2017-10-03 NOTE — Progress Notes (Signed)
  [facility]  ROUNDING NOTE   Subjective:  Patient resting comfortably in bed. Patient was dialyzed earlier today 1.3 L of fluid was removed Nurse reports patient was agitated and trying to climb out of bed earlier today  Objective:  Vital signs in last 24 hours:  Temperature 97.6, pulse 91, respirations 16, blood pressure 151/71  Physical Exam: General: No acute distress  Head: [place]/AT OM moist  Eyes: Anicteric  Neck: Tracheostomy in place  Lungs:  Scattered rhonchi, nromal effort  Heart: S1S2 no rubs  Abdomen:  Soft, nontender, bowel sounds present  Extremities: trace peripheral edema, L BKA  Neurologic:  Sleeping at present  Skin: No lesions  Access: L IJ Permcath    Basic Metabolic Panel: Recent Labs  Lab 09/29/17 0949 10/01/17 0842 10/02/17 1100 10/03/17 0500  NA 132* 137 135 136  K 4.3 3.9 4.1 3.9  CL 97* 99* 98* 98*  CO2 22 26 27 28   GLUCOSE 146* 134* 152* 132*  BUN 75* 65* 77* 45*  CREATININE 6.63* 6.00* 6.78* 4.68*  CALCIUM 8.6* 8.9 8.6* 8.6*  PHOS 3.2 3.6 3.8 2.8    Liver Function Tests: Recent Labs  Lab 09/29/17 0949 10/01/17 0842 10/02/17 1100 10/03/17 0500  ALBUMIN 1.9* 2.1* 2.2* 2.2*   No results for input(s): LIPASE, AMYLASE in the last 168 hours. No results for input(s): AMMONIA in the last 168 hours.  CBC: Recent Labs  Lab 09/29/17 0949 10/01/17 0842 10/02/17 1100 10/03/17 0500  WBC 9.8 10.3 10.2 9.3  HGB 7.3* 7.5* 7.7* 7.4*  HCT 23.3* 25.0* 25.5* 24.6*  MCV 78.2 79.4 79.9 80.1  PLT 388 340 408* 363    Cardiac Enzymes: No results for input(s): CKTOTAL, CKMB, CKMBINDEX, TROPONINI in the last 168 hours.  BNP: Invalid input(s): POCBNP  CBG: No results for input(s): GLUCAP in the last 168 hours.  Microbiology: No results found for this or any previous visit.  Coagulation Studies: No results for input(s): LABPROT, INR in the last 72 hours.  Urinalysis: No results for input(s): COLORURINE, LABSPEC, PHURINE,  GLUCOSEU, HGBUR, BILIRUBINUR, KETONESUR, PROTEINUR, UROBILINOGEN, NITRITE, LEUKOCYTESUR in the last 72 hours.  Invalid input(s): APPERANCEUR    Imaging: No results found.   Medications:       Assessment/ Plan:  67 y.o. male with a PMHx of end-stage renal disease on hemodialysis, hypertension, diabetes mellitus, coronary disease status post CABG, peripheral vascular disease status post left below the knee amputation, anemia of chronic kidney disease, and secondary hyperparathyroidism, who was admitted to [facility] on 08/23/2017 for ongoing management of respiratory failure, encephalopathy, and end-stage renal disease.   1.  ESRD on HD MWF.     Continue Monday, Wednesday, Friday schedule  2.  Anemia of chronic kidney disease.    Hemoglobin currently 7.4.  Maintain the patient on iv retacrit 10000 units MWF.   3.  Secondary hyperparathyroidism.    Phosphorus currently 2.8. Keep the patient off of calcium acetate.  4.  Acute respiratory failure.   Trach in place   LOS: 0 Michael Moody Michael Moody 3/8/20194:48 PM

## 2017-10-04 LAB — HEPATITIS B SURFACE ANTIGEN: Hepatitis B Surface Ag: NEGATIVE

## 2017-10-06 LAB — CBC
HEMATOCRIT: 25.9 % — AB (ref 39.0–52.0)
Hemoglobin: 7.7 g/dL — ABNORMAL LOW (ref 13.0–17.0)
MCH: 23.7 pg — ABNORMAL LOW (ref 26.0–34.0)
MCHC: 29.7 g/dL — AB (ref 30.0–36.0)
MCV: 79.7 fL (ref 78.0–100.0)
PLATELETS: 332 10*3/uL (ref 150–400)
RBC: 3.25 MIL/uL — ABNORMAL LOW (ref 4.22–5.81)
RDW: 18.7 % — ABNORMAL HIGH (ref 11.5–15.5)
WBC: 8.2 10*3/uL (ref 4.0–10.5)

## 2017-10-06 LAB — RENAL FUNCTION PANEL
ALBUMIN: 2.3 g/dL — AB (ref 3.5–5.0)
Anion gap: 10 (ref 5–15)
BUN: 67 mg/dL — AB (ref 6–20)
CO2: 26 mmol/L (ref 22–32)
CREATININE: 5.76 mg/dL — AB (ref 0.61–1.24)
Calcium: 8.5 mg/dL — ABNORMAL LOW (ref 8.9–10.3)
Chloride: 99 mmol/L — ABNORMAL LOW (ref 101–111)
GFR calc Af Amer: 11 mL/min — ABNORMAL LOW (ref >60)
GFR, EST NON AFRICAN AMERICAN: 9 mL/min — AB (ref >60)
GLUCOSE: 179 mg/dL — AB (ref 65–99)
PHOSPHORUS: 4.8 mg/dL — AB (ref 2.5–4.6)
POTASSIUM: 4.1 mmol/L (ref 3.5–5.1)
SODIUM: 135 mmol/L (ref 135–145)

## 2017-10-06 NOTE — Progress Notes (Signed)
  [facility]  ROUNDING NOTE   Subjective:  Patient seen and evalauted during HD. BFR 320. Moving around the bed quite a bit.   Objective:  Vital signs in last 24 hours:  Temperature 98.2 pulse 60 blood pressure 167/90  Physical Exam: General: No acute distress  Head: [place]/AT OM moist  Eyes: Anicteric  Neck: Tracheostomy in place  Lungs:  Scattered rhonchi, nromal effort  Heart: S1S2 no rubs  Abdomen:  Soft, nontender, bowel sounds present  Extremities: trace peripheral edema, L BKA  Neurologic: Awake but not following commands  Skin: No lesions  Access: L IJ Permcath    Basic Metabolic Panel: Recent Labs  Lab 10/01/17 0842 10/02/17 1100 10/03/17 0500 10/06/17 0831  NA 137 135 136 135  K 3.9 4.1 3.9 4.1  CL 99* 98* 98* 99*  CO2 26 27 28 26   GLUCOSE 134* 152* 132* 179*  BUN 65* 77* 45* 67*  CREATININE 6.00* 6.78* 4.68* 5.76*  CALCIUM 8.9 8.6* 8.6* 8.5*  PHOS 3.6 3.8 2.8 4.8*    Liver Function Tests: Recent Labs  Lab 10/01/17 0842 10/02/17 1100 10/03/17 0500 10/06/17 0831  ALBUMIN 2.1* 2.2* 2.2* 2.3*   No results for input(s): LIPASE, AMYLASE in the last 168 hours. No results for input(s): AMMONIA in the last 168 hours.  CBC: Recent Labs  Lab 10/01/17 0842 10/02/17 1100 10/03/17 0500 10/06/17 0831  WBC 10.3 10.2 9.3 8.2  HGB 7.5* 7.7* 7.4* 7.7*  HCT 25.0* 25.5* 24.6* 25.9*  MCV 79.4 79.9 80.1 79.7  PLT 340 408* 363 332    Cardiac Enzymes: No results for input(s): CKTOTAL, CKMB, CKMBINDEX, TROPONINI in the last 168 hours.  BNP: Invalid input(s): POCBNP  CBG: No results for input(s): GLUCAP in the last 168 hours.  Microbiology: No results found for this or any previous visit.  Coagulation Studies: No results for input(s): LABPROT, INR in the last 72 hours.  Urinalysis: No results for input(s): COLORURINE, LABSPEC, PHURINE, GLUCOSEU, HGBUR, BILIRUBINUR, KETONESUR, PROTEINUR, UROBILINOGEN, NITRITE, LEUKOCYTESUR in the last 72  hours.  Invalid input(s): APPERANCEUR    Imaging: No results found.   Medications:       Assessment/ Plan:  67 y.o. male with a PMHx of end-stage renal disease on hemodialysis, hypertension, diabetes mellitus, coronary disease status post CABG, peripheral vascular disease status post left below the knee amputation, anemia of chronic kidney disease, and secondary hyperparathyroidism, who was admitted to [facility] on 08/23/2017 for ongoing management of respiratory failure, encephalopathy, and end-stage renal disease.   1.  ESRD on HD MWF.     Patient seen and evaluated during HD, tolerating well, BFR 320 at the moment.   2.  Anemia of chronic kidney disease.    Hgb currently 7.7, continue retacrit 10000 units IV with HD. Recheck hgb on Wednesday.  3.  Secondary hyperparathyroidism.   Phos doing well off binders, to continue to monitor.   4.  Acute respiratory failure.   Trach functioning well, trach collar on at the moment as well.    LOS: 0 Alonie Gazzola 3/11/20191:39 PM

## 2017-10-08 LAB — CBC
HCT: 26.2 % — ABNORMAL LOW (ref 39.0–52.0)
Hemoglobin: 7.8 g/dL — ABNORMAL LOW (ref 13.0–17.0)
MCH: 23.9 pg — ABNORMAL LOW (ref 26.0–34.0)
MCHC: 29.8 g/dL — ABNORMAL LOW (ref 30.0–36.0)
MCV: 80.4 fL (ref 78.0–100.0)
PLATELETS: 267 10*3/uL (ref 150–400)
RBC: 3.26 MIL/uL — AB (ref 4.22–5.81)
RDW: 18.8 % — ABNORMAL HIGH (ref 11.5–15.5)
WBC: 6.8 10*3/uL (ref 4.0–10.5)

## 2017-10-08 LAB — RENAL FUNCTION PANEL
Albumin: 2.4 g/dL — ABNORMAL LOW (ref 3.5–5.0)
Anion gap: 10 (ref 5–15)
BUN: 53 mg/dL — ABNORMAL HIGH (ref 6–20)
CHLORIDE: 98 mmol/L — AB (ref 101–111)
CO2: 27 mmol/L (ref 22–32)
Calcium: 8.8 mg/dL — ABNORMAL LOW (ref 8.9–10.3)
Creatinine, Ser: 5.14 mg/dL — ABNORMAL HIGH (ref 0.61–1.24)
GFR, EST AFRICAN AMERICAN: 12 mL/min — AB (ref >60)
GFR, EST NON AFRICAN AMERICAN: 10 mL/min — AB (ref >60)
Glucose, Bld: 132 mg/dL — ABNORMAL HIGH (ref 65–99)
POTASSIUM: 4.4 mmol/L (ref 3.5–5.1)
Phosphorus: 4.8 mg/dL — ABNORMAL HIGH (ref 2.5–4.6)
Sodium: 135 mmol/L (ref 135–145)

## 2017-10-08 NOTE — Progress Notes (Addendum)
  [facility]  ROUNDING NOTE   Subjective:  Patient due for hemodialysis today. Resting comfortably in bed at the moment.  Objective:  Vital signs in last 24 hours:  Temperature temperature 98 pulse 66 respiration 17 blood pressure 140/69  Physical Exam: General: No acute distress  Head: /AT OM moist  Eyes: Anicteric  Neck: Tracheostomy in place  Lungs:  Scattered rhonchi, normal effort  Heart: S1S2 no rubs  Abdomen:  Soft, nontender, bowel sounds present  Extremities: trace peripheral edema, L BKA  Neurologic: Awake but not following commands  Skin: No lesions  Access: L IJ Permcath    Basic Metabolic Panel: Recent Labs  Lab 10/02/17 1100 10/03/17 0500 10/06/17 0831 10/08/17 0736  NA 135 136 135 135  K 4.1 3.9 4.1 4.4  CL 98* 98* 99* 98*  CO2 27 28 26 27   GLUCOSE 152* 132* 179* 132*  BUN 77* 45* 67* 53*  CREATININE 6.78* 4.68* 5.76* 5.14*  CALCIUM 8.6* 8.6* 8.5* 8.8*  PHOS 3.8 2.8 4.8* 4.8*    Liver Function Tests: Recent Labs  Lab 10/02/17 1100 10/03/17 0500 10/06/17 0831 10/08/17 0736  ALBUMIN 2.2* 2.2* 2.3* 2.4*   No results for input(s): LIPASE, AMYLASE in the last 168 hours. No results for input(s): AMMONIA in the last 168 hours.  CBC: Recent Labs  Lab 10/02/17 1100 10/03/17 0500 10/06/17 0831 10/08/17 0736  WBC 10.2 9.3 8.2 6.8  HGB 7.7* 7.4* 7.7* 7.8*  HCT 25.5* 24.6* 25.9* 26.2*  MCV 79.9 80.1 79.7 80.4  PLT 408* 363 332 267    Cardiac Enzymes: No results for input(s): CKTOTAL, CKMB, CKMBINDEX, TROPONINI in the last 168 hours.  BNP: Invalid input(s): POCBNP  CBG: No results for input(s): GLUCAP in the last 168 hours.  Microbiology: No results found for this or any previous visit.  Coagulation Studies: No results for input(s): LABPROT, INR in the last 72 hours.  Urinalysis: No results for input(s): COLORURINE, LABSPEC, PHURINE, GLUCOSEU, HGBUR, BILIRUBINUR, KETONESUR, PROTEINUR, UROBILINOGEN, NITRITE,  LEUKOCYTESUR in the last 72 hours.  Invalid input(s): APPERANCEUR    Imaging: No results found.   Medications:       Assessment/ Plan:  67 y.o. male with a PMHx of end-stage renal disease on hemodialysis, hypertension, diabetes mellitus, coronary disease status post CABG, peripheral vascular disease status post left below the knee amputation, anemia of chronic kidney disease, and secondary hyperparathyroidism, who was admitted to [facility] on 08/23/2017 for ongoing management of respiratory failure, encephalopathy, and end-stage renal disease.   1.  ESRD on HD MWF.     Recent seen and evaluated at bedside.  We plan for dialysis today.  Orders have been prepared.  2.  Anemia of chronic kidney disease.    Hemoglobin stable at 7.8, continue retacrit 10000 units IV with HD.   3.  Secondary hyperparathyroidism.    Phosphorus currently 4.8 and acceptable.  Hold off on binder therapy.  4.  Acute respiratory failure.   Patient has not been decannulated and still has a tracheostomy in place.  Continue local trach care.   LOS: 0 Michael Moody 3/13/20194:54 PM

## 2017-10-09 ENCOUNTER — Other Ambulatory Visit (HOSPITAL_COMMUNITY): Payer: Medicaid Other

## 2017-10-10 LAB — RENAL FUNCTION PANEL
ANION GAP: 11 (ref 5–15)
Albumin: 2.3 g/dL — ABNORMAL LOW (ref 3.5–5.0)
BUN: 55 mg/dL — ABNORMAL HIGH (ref 6–20)
CO2: 26 mmol/L (ref 22–32)
Calcium: 8.6 mg/dL — ABNORMAL LOW (ref 8.9–10.3)
Chloride: 94 mmol/L — ABNORMAL LOW (ref 101–111)
Creatinine, Ser: 4.98 mg/dL — ABNORMAL HIGH (ref 0.61–1.24)
GFR calc Af Amer: 13 mL/min — ABNORMAL LOW (ref >60)
GFR calc non Af Amer: 11 mL/min — ABNORMAL LOW (ref >60)
GLUCOSE: 137 mg/dL — AB (ref 65–99)
POTASSIUM: 4 mmol/L (ref 3.5–5.1)
Phosphorus: 4.1 mg/dL (ref 2.5–4.6)
SODIUM: 131 mmol/L — AB (ref 135–145)

## 2017-10-10 LAB — CBC
HEMATOCRIT: 23.3 % — AB (ref 39.0–52.0)
Hemoglobin: 6.9 g/dL — CL (ref 13.0–17.0)
MCH: 23.6 pg — AB (ref 26.0–34.0)
MCHC: 29.6 g/dL — ABNORMAL LOW (ref 30.0–36.0)
MCV: 79.8 fL (ref 78.0–100.0)
Platelets: 246 10*3/uL (ref 150–400)
RBC: 2.92 MIL/uL — ABNORMAL LOW (ref 4.22–5.81)
RDW: 18.6 % — AB (ref 11.5–15.5)
WBC: 7.3 10*3/uL (ref 4.0–10.5)

## 2017-10-10 LAB — PREPARE RBC (CROSSMATCH)

## 2017-10-10 LAB — ABO/RH: ABO/RH(D): O POS

## 2017-10-10 NOTE — Progress Notes (Signed)
[facility]  ROUNDING NOTE   Subjective:  Patient continues dialysis on Monday, Wednesday, Friday schedule. Patient is resting at the moment. Doesn't appear to be agitated.  Objective:  Vital signs in last 24 hours:  Temperature 98.3 pulse 86 respirations 20 blood pressure 121/68  Physical Exam: General: No acute distress  Head: [place]/AT OM moist  Eyes: Anicteric  Neck: Tracheostomy in place  Lungs:  Scattered rhonchi, normal effort  Heart: S1S2 no rubs  Abdomen:  Soft, nontender, bowel sounds present  Extremities: trace peripheral edema, L BKA  Neurologic: Awake but not following commands  Skin: No lesions  Access: L IJ Permcath    Basic Metabolic Panel: Recent Labs  Lab 10/06/17 0831 10/08/17 0736 10/10/17 0710  NA 135 135 131*  K 4.1 4.4 4.0  CL 99* 98* 94*  CO2 26 27 26   GLUCOSE 179* 132* 137*  BUN 67* 53* 55*  CREATININE 5.76* 5.14* 4.98*  CALCIUM 8.5* 8.8* 8.6*  PHOS 4.8* 4.8* 4.1    Liver Function Tests: Recent Labs  Lab 10/06/17 0831 10/08/17 0736 10/10/17 0710  ALBUMIN 2.3* 2.4* 2.3*   No results for input(s): LIPASE, AMYLASE in the last 168 hours. No results for input(s): AMMONIA in the last 168 hours.  CBC: Recent Labs  Lab 10/06/17 0831 10/08/17 0736 10/10/17 0710  WBC 8.2 6.8 7.3  HGB 7.7* 7.8* 6.9*  HCT 25.9* 26.2* 23.3*  MCV 79.7 80.4 79.8  PLT 332 267 246    Cardiac Enzymes: No results for input(s): CKTOTAL, CKMB, CKMBINDEX, TROPONINI in the last 168 hours.  BNP: Invalid input(s): POCBNP  CBG: No results for input(s): GLUCAP in the last 168 hours.  Microbiology: No results found for this or any previous visit.  Coagulation Studies: No results for input(s): LABPROT, INR in the last 72 hours.  Urinalysis: No results for input(s): COLORURINE, LABSPEC, PHURINE, GLUCOSEU, HGBUR, BILIRUBINUR, KETONESUR, PROTEINUR, UROBILINOGEN, NITRITE, LEUKOCYTESUR in the last 72 hours.  Invalid input(s): APPERANCEUR     Imaging: Dg Chest Port 1 View  Result Date: 10/09/2017 CLINICAL DATA:  End-stage renal disease EXAM: PORTABLE CHEST 1 VIEW COMPARISON:  09/02/2017 FINDINGS: Interval placement of tracheostomy tube which projects over the mid trachea. No pneumothorax. Left dialysis catheter is unchanged. Prior median sternotomy. Mild cardiomegaly. Bibasilar atelectasis or infiltrates, left slightly greater than right. Small bilateral pleural effusions. IMPRESSION: Stable cardiomegaly. Bibasilar atelectasis or infiltrates, left greater than right, slightly progressed since prior study. Small effusions. Electronically Signed   By: Rolm Baptise M.D.   On: 10/09/2017 10:30     Medications:       Assessment/ Plan:  67 y.o. male with a PMHx of end-stage renal disease on hemodialysis, hypertension, diabetes mellitus, coronary disease status post CABG, peripheral vascular disease status post left below the knee amputation, anemia of chronic kidney disease, and secondary hyperparathyroidism, who was admitted to [facility] on 08/23/2017 for ongoing management of respiratory failure, encephalopathy, and end-stage renal disease.   1.  ESRD on HD MWF.     Next dialysis treatment planned for Monday. We will prepare orders.  2.  Anemia of chronic kidney disease.    Hemoglobin down to 6.9.  Patient status post transfusion. Recheck hemoglobin over the weekend.  3.  Secondary hyperparathyroidism.    Phosphorus was rechecked today and was 4.1 which is in target range.  4.  Acute respiratory failure.   Off the vent, has trach in place, on trach collar, continue respiratory support.    LOS: 0  Telford Archambeau 3/15/20198:14 PM

## 2017-10-11 LAB — BPAM RBC
BLOOD PRODUCT EXPIRATION DATE: 201904082359
ISSUE DATE / TIME: 201903150934
UNIT TYPE AND RH: 5100

## 2017-10-11 LAB — TYPE AND SCREEN
ABO/RH(D): O POS
ANTIBODY SCREEN: NEGATIVE
Unit division: 0

## 2017-10-13 LAB — RENAL FUNCTION PANEL
ANION GAP: 11 (ref 5–15)
Albumin: 2.3 g/dL — ABNORMAL LOW (ref 3.5–5.0)
BUN: 72 mg/dL — ABNORMAL HIGH (ref 6–20)
CHLORIDE: 95 mmol/L — AB (ref 101–111)
CO2: 26 mmol/L (ref 22–32)
CREATININE: 6.09 mg/dL — AB (ref 0.61–1.24)
Calcium: 8.7 mg/dL — ABNORMAL LOW (ref 8.9–10.3)
GFR calc non Af Amer: 9 mL/min — ABNORMAL LOW (ref >60)
GFR, EST AFRICAN AMERICAN: 10 mL/min — AB (ref >60)
Glucose, Bld: 153 mg/dL — ABNORMAL HIGH (ref 65–99)
Phosphorus: 4.8 mg/dL — ABNORMAL HIGH (ref 2.5–4.6)
Potassium: 3.7 mmol/L (ref 3.5–5.1)
Sodium: 132 mmol/L — ABNORMAL LOW (ref 135–145)

## 2017-10-13 LAB — CBC
HCT: 26.5 % — ABNORMAL LOW (ref 39.0–52.0)
Hemoglobin: 8.2 g/dL — ABNORMAL LOW (ref 13.0–17.0)
MCH: 24.9 pg — ABNORMAL LOW (ref 26.0–34.0)
MCHC: 30.9 g/dL (ref 30.0–36.0)
MCV: 80.5 fL (ref 78.0–100.0)
PLATELETS: 310 10*3/uL (ref 150–400)
RBC: 3.29 MIL/uL — AB (ref 4.22–5.81)
RDW: 19 % — ABNORMAL HIGH (ref 11.5–15.5)
WBC: 5.8 10*3/uL (ref 4.0–10.5)

## 2017-10-13 NOTE — Progress Notes (Signed)
  [facility]  ROUNDING NOTE   Subjective:  Patient resting comfortably in bed. Tracheostomy appears functional.   Objective:  Vital signs in last 24 hours:  Temperature 98.4 pulse 75 respirations 15 blood pressure 123/66  Physical Exam: General: No acute distress  Head: [place]/AT OM moist  Eyes: Anicteric  Neck: Tracheostomy in place  Lungs:  Scattered rhonchi, normal effort  Heart: S1S2 no rubs  Abdomen:  Soft, nontender, bowel sounds present  Extremities: trace peripheral edema, L BKA  Neurologic: Awake but not following commands  Skin: No lesions  Access: L IJ Permcath    Basic Metabolic Panel: Recent Labs  Lab 10/08/17 0736 10/10/17 0710 10/13/17 0738  NA 135 131* 132*  K 4.4 4.0 3.7  CL 98* 94* 95*  CO2 27 26 26   GLUCOSE 132* 137* 153*  BUN 53* 55* 72*  CREATININE 5.14* 4.98* 6.09*  CALCIUM 8.8* 8.6* 8.7*  PHOS 4.8* 4.1 4.8*    Liver Function Tests: Recent Labs  Lab 10/08/17 0736 10/10/17 0710 10/13/17 0738  ALBUMIN 2.4* 2.3* 2.3*   No results for input(s): LIPASE, AMYLASE in the last 168 hours. No results for input(s): AMMONIA in the last 168 hours.  CBC: Recent Labs  Lab 10/08/17 0736 10/10/17 0710 10/13/17 0737  WBC 6.8 7.3 5.8  HGB 7.8* 6.9* 8.2*  HCT 26.2* 23.3* 26.5*  MCV 80.4 79.8 80.5  PLT 267 246 310    Cardiac Enzymes: No results for input(s): CKTOTAL, CKMB, CKMBINDEX, TROPONINI in the last 168 hours.  BNP: Invalid input(s): POCBNP  CBG: No results for input(s): GLUCAP in the last 168 hours.  Microbiology: No results found for this or any previous visit.  Coagulation Studies: No results for input(s): LABPROT, INR in the last 72 hours.  Urinalysis: No results for input(s): COLORURINE, LABSPEC, PHURINE, GLUCOSEU, HGBUR, BILIRUBINUR, KETONESUR, PROTEINUR, UROBILINOGEN, NITRITE, LEUKOCYTESUR in the last 72 hours.  Invalid input(s): APPERANCEUR    Imaging: No results found.   Medications:        Assessment/ Plan:  67 y.o. male with a PMHx of end-stage renal disease on hemodialysis, hypertension, diabetes mellitus, coronary disease status post CABG, peripheral vascular disease status post left below the knee amputation, anemia of chronic kidney disease, and secondary hyperparathyroidism, who was admitted to [facility] on 08/23/2017 for ongoing management of respiratory failure, encephalopathy, and end-stage renal disease.   1.  ESRD on HD MWF.     Patient completed hemodialysis today.  Next dialysis on Wednesday.  2.  Anemia of chronic kidney disease.    Hemoglobin up to 8.2 post transfusion.  Maintain the patient on erythropoietin stimulating agents 10,000 units IV with dialysis.  3.  Secondary hyperparathyroidism.    Phosphorus currently 4.8.  Recheck serum phosphorus with next dialysis treatment.  4.  Acute respiratory failure.   Still having some secretions from his tracheostomy.  Otherwise tracheostomy appears to be functioning well.  Continue respiratory support.    LOS: 0 Raciel Caffrey 3/18/20194:11 PM

## 2017-10-15 LAB — RENAL FUNCTION PANEL
ALBUMIN: 2.6 g/dL — AB (ref 3.5–5.0)
Anion gap: 11 (ref 5–15)
BUN: 74 mg/dL — AB (ref 6–20)
CO2: 25 mmol/L (ref 22–32)
CREATININE: 5.85 mg/dL — AB (ref 0.61–1.24)
Calcium: 9 mg/dL (ref 8.9–10.3)
Chloride: 94 mmol/L — ABNORMAL LOW (ref 101–111)
GFR calc Af Amer: 10 mL/min — ABNORMAL LOW (ref >60)
GFR calc non Af Amer: 9 mL/min — ABNORMAL LOW (ref >60)
GLUCOSE: 200 mg/dL — AB (ref 65–99)
Phosphorus: 4.9 mg/dL — ABNORMAL HIGH (ref 2.5–4.6)
Potassium: 4 mmol/L (ref 3.5–5.1)
SODIUM: 130 mmol/L — AB (ref 135–145)

## 2017-10-15 LAB — CBC
HCT: 27.7 % — ABNORMAL LOW (ref 39.0–52.0)
Hemoglobin: 8.6 g/dL — ABNORMAL LOW (ref 13.0–17.0)
MCH: 25.1 pg — ABNORMAL LOW (ref 26.0–34.0)
MCHC: 31 g/dL (ref 30.0–36.0)
MCV: 81 fL (ref 78.0–100.0)
PLATELETS: 295 10*3/uL (ref 150–400)
RBC: 3.42 MIL/uL — ABNORMAL LOW (ref 4.22–5.81)
RDW: 18.9 % — AB (ref 11.5–15.5)
WBC: 8.8 10*3/uL (ref 4.0–10.5)

## 2017-10-15 NOTE — Progress Notes (Signed)
  [facility]  ROUNDING NOTE   Subjective:  Patient seen and evaluated during hemodialysis. Hypotension during dialysis noted. Albumin 25 g IV x1 has been ordered.  Objective:  Vital signs in last 24 hours:  Temperature 97.3 pulse 74 respirations 13 blood pressure 86/37  Physical Exam: General: No acute distress  Head: [place]/AT OM moist  Eyes: Anicteric  Neck: Tracheostomy in place  Lungs:  Scattered rhonchi, normal effort  Heart: S1S2 no rubs  Abdomen:  Soft, nontender, bowel sounds present  Extremities: trace peripheral edema, L BKA  Neurologic: Awake follows simple commands  Skin: No lesions  Access: L IJ Permcath    Basic Metabolic Panel: Recent Labs  Lab 10/10/17 0710 10/13/17 0738 10/15/17 0818  NA 131* 132* 130*  K 4.0 3.7 4.0  CL 94* 95* 94*  CO2 26 26 25   GLUCOSE 137* 153* 200*  BUN 55* 72* 74*  CREATININE 4.98* 6.09* 5.85*  CALCIUM 8.6* 8.7* 9.0  PHOS 4.1 4.8* 4.9*    Liver Function Tests: Recent Labs  Lab 10/10/17 0710 10/13/17 0738 10/15/17 0818  ALBUMIN 2.3* 2.3* 2.6*   No results for input(s): LIPASE, AMYLASE in the last 168 hours. No results for input(s): AMMONIA in the last 168 hours.  CBC: Recent Labs  Lab 10/10/17 0710 10/13/17 0737 10/15/17 0818  WBC 7.3 5.8 8.8  HGB 6.9* 8.2* 8.6*  HCT 23.3* 26.5* 27.7*  MCV 79.8 80.5 81.0  PLT 246 310 295    Cardiac Enzymes: No results for input(s): CKTOTAL, CKMB, CKMBINDEX, TROPONINI in the last 168 hours.  BNP: Invalid input(s): POCBNP  CBG: No results for input(s): GLUCAP in the last 168 hours.  Microbiology: No results found for this or any previous visit.  Coagulation Studies: No results for input(s): LABPROT, INR in the last 72 hours.  Urinalysis: No results for input(s): COLORURINE, LABSPEC, PHURINE, GLUCOSEU, HGBUR, BILIRUBINUR, KETONESUR, PROTEINUR, UROBILINOGEN, NITRITE, LEUKOCYTESUR in the last 72 hours.  Invalid input(s): APPERANCEUR    Imaging: No  results found.   Medications:       Assessment/ Plan:  67 y.o. male with a PMHx of end-stage renal disease on hemodialysis, hypertension, diabetes mellitus, coronary disease status post CABG, peripheral vascular disease status post left below the knee amputation, anemia of chronic kidney disease, and secondary hyperparathyroidism, who was admitted to [facility] on 08/23/2017 for ongoing management of respiratory failure, encephalopathy, and end-stage renal disease.   1.  ESRD on HD MWF.     Patient seen and evaluated during hemodialysis.  Albumin 25 g IV x1 has been ordered given relative hypotension.  2.  Anemia of chronic kidney disease.    Hemoglobin now up to 8.6.  Continue retacrit 10,000 units IV with dialysis.  3.  Secondary hyperparathyroidism.    Phos stable at 4.9, continue to check with HD.   4.  Acute respiratory failure.   Stable tracheostomy at this time.  Continue to monitor his status.   LOS: 0 Therman Hughlett 3/20/20194:01 PM

## 2017-10-16 DIAGNOSIS — J9621 Acute and chronic respiratory failure with hypoxia: Secondary | ICD-10-CM

## 2017-10-17 ENCOUNTER — Encounter: Payer: Self-pay | Admitting: Internal Medicine

## 2017-10-17 DIAGNOSIS — I2583 Coronary atherosclerosis due to lipid rich plaque: Secondary | ICD-10-CM | POA: Diagnosis not present

## 2017-10-17 DIAGNOSIS — N186 End stage renal disease: Secondary | ICD-10-CM | POA: Diagnosis not present

## 2017-10-17 DIAGNOSIS — I2510 Atherosclerotic heart disease of native coronary artery without angina pectoris: Secondary | ICD-10-CM | POA: Diagnosis present

## 2017-10-17 DIAGNOSIS — Z992 Dependence on renal dialysis: Secondary | ICD-10-CM | POA: Diagnosis not present

## 2017-10-17 DIAGNOSIS — J9621 Acute and chronic respiratory failure with hypoxia: Secondary | ICD-10-CM | POA: Diagnosis not present

## 2017-10-17 DIAGNOSIS — E1129 Type 2 diabetes mellitus with other diabetic kidney complication: Secondary | ICD-10-CM | POA: Diagnosis present

## 2017-10-17 LAB — RENAL FUNCTION PANEL
ALBUMIN: 2.8 g/dL — AB (ref 3.5–5.0)
Anion gap: 15 (ref 5–15)
BUN: 64 mg/dL — AB (ref 6–20)
CO2: 20 mmol/L — ABNORMAL LOW (ref 22–32)
CREATININE: 5.51 mg/dL — AB (ref 0.61–1.24)
Calcium: 9.2 mg/dL (ref 8.9–10.3)
Chloride: 95 mmol/L — ABNORMAL LOW (ref 101–111)
GFR, EST AFRICAN AMERICAN: 11 mL/min — AB (ref >60)
GFR, EST NON AFRICAN AMERICAN: 10 mL/min — AB (ref >60)
Glucose, Bld: 153 mg/dL — ABNORMAL HIGH (ref 65–99)
PHOSPHORUS: 4.2 mg/dL (ref 2.5–4.6)
Potassium: 4.2 mmol/L (ref 3.5–5.1)
Sodium: 130 mmol/L — ABNORMAL LOW (ref 135–145)

## 2017-10-17 LAB — CBC
HEMATOCRIT: 31 % — AB (ref 39.0–52.0)
Hemoglobin: 9.8 g/dL — ABNORMAL LOW (ref 13.0–17.0)
MCH: 25.2 pg — ABNORMAL LOW (ref 26.0–34.0)
MCHC: 31.6 g/dL (ref 30.0–36.0)
MCV: 79.7 fL (ref 78.0–100.0)
PLATELETS: 203 10*3/uL (ref 150–400)
RBC: 3.89 MIL/uL — AB (ref 4.22–5.81)
RDW: 19.2 % — AB (ref 11.5–15.5)
WBC: 6.6 10*3/uL (ref 4.0–10.5)

## 2017-10-17 NOTE — Progress Notes (Signed)
[facility]  Date of Service: October 17, 2017  Follow Up Progress Note   Michael Moody  DJM:[id number]  DOB: [birth year]   DOA: 09/08/2017  Referring Physician: Merton Border, MD   HPI: Michael Moody is a 67 y.o. male seen for follow up of Acute on Chronic Respiratory Failure.  Patient is on T collar, looks comfortable right now without distress.  Has a size 4 trach in place   Review of Systems:   ROS performed and is unremarkable other than noted above.  Past Medical History:  Diagnosis Date  . CAD (coronary artery disease)   . DM (diabetes mellitus) type II controlled with renal manifestation ([place])   . ESRD (end stage renal disease) on dialysis ([place])   . HTN (hypertension)     Past Surgical History:  Procedure Laterality Date  . BELOW KNEE LEG AMPUTATION    . PEG PLACEMENT    . TRACHEOSTOMY TUBE PLACEMENT N/A 09/08/2017   Procedure: TRACHEOSTOMY;  Surgeon: Rozetta Nunnery, MD;  Location: [facility] OR;  Service: ENT;  Laterality: N/A;    Social History:    has no tobacco, alcohol, and drug history on file.  Family History: Non-Contributory to the present illness  Allergies no known allergies  Medications:  Reviewed on Rounds  Physical Exam:  Vitals: Temperature 97.0 pulse 70 respiratory rate 11 blood pressure 148/77 saturations 100%  Ventilator Settings are T collar FiO2 28%  . General: Comfortable at this time . Eyes: Grossly normal lids, irises & conjunctiva . ENT: grossly tongue is normal . Neck: no obvious mass . Cardiovascular: S1-S2 normal no gallop . Respiratory: No rhonchi expansion equal . Abdomen: Soft and nontender . Skin: no rash seen on limited exam . Musculoskeletal: not rigid . Psychiatric:unable to assess . Neurologic: no seizure no involuntary movements          Labs on Admission:  Basic Metabolic Panel: Recent Labs  Lab 10/13/17 0738 10/15/17 0818  10/17/17 0635  NA 132* 130* 130*  K 3.7 4.0 4.2  CL 95* 94* 95*  CO2 26 25 20*  GLUCOSE 153* 200* 153*  BUN 72* 74* 64*  CREATININE 6.09* 5.85* 5.51*  CALCIUM 8.7* 9.0 9.2  PHOS 4.8* 4.9* 4.2    Liver Function Tests: Recent Labs  Lab 10/13/17 0738 10/15/17 0818 10/17/17 0635  ALBUMIN 2.3* 2.6* 2.8*   No results for input(s): LIPASE, AMYLASE in the last 168 hours. No results for input(s): AMMONIA in the last 168 hours.  CBC: Recent Labs  Lab 10/13/17 0737 10/15/17 0818 10/17/17 0635  WBC 5.8 8.8 6.6  HGB 8.2* 8.6* 9.8*  HCT 26.5* 27.7* 31.0*  MCV 80.5 81.0 79.7  PLT 310 295 203    Cardiac Enzymes: No results for input(s): CKTOTAL, CKMB, CKMBINDEX, TROPONINI in the last 168 hours.  BNP (last 3 results) No results for input(s): BNP in the last 8760 hours.  ProBNP (last 3 results) No results for input(s): PROBNP in the last 8760 hours.   Radiological Exams on Admission: No results found.     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 Acute on chronic respiratory failure with hypoxia patient is doing fine with T collar.  Has been tolerating the size 4 trach.  We will  make an attempt at capping at this time. 2. End-stage renal disease on dialysis patient is being followed by nephrology will continue with supportive care 3. Coronary artery disease at this time stable no active issue      I have personally seen and evaluated the patient, evaluated laboratory and imaging results, formulated the assessment and plan and placed orders. The Patient requires high complexity decision making for assessment and support.  Case was discussed on Rounds with the Respiratory Therapy Staff   Allyne Gee, MD [facility] Pulmonary Critical Care Medicine Sleep  Medicine

## 2017-10-17 NOTE — Progress Notes (Signed)
  [facility]  ROUNDING NOTE   Subjective:  Patient will be due for hemodialysis today.  He is currently resting in bed. She still has some periods of agitation.  Objective:  Vital signs in last 24 hours:  Temperature 97 pulse 70 respirations 11 blood pressure 148/77  Physical Exam: General: No acute distress  Head: [place]/AT OM moist  Eyes: Anicteric  Neck: Tracheostomy in place  Lungs:  Scattered rhonchi, normal effort  Heart: S1S2 no rubs  Abdomen:  Soft, nontender, bowel sounds present  Extremities: trace peripheral edema, L BKA  Neurologic: Awake follows simple commands  Skin: No lesions  Access: L IJ Permcath    Basic Metabolic Panel: Recent Labs  Lab 10/13/17 0738 10/15/17 0818 10/17/17 0635  NA 132* 130* 130*  K 3.7 4.0 4.2  CL 95* 94* 95*  CO2 26 25 20*  GLUCOSE 153* 200* 153*  BUN 72* 74* 64*  CREATININE 6.09* 5.85* 5.51*  CALCIUM 8.7* 9.0 9.2  PHOS 4.8* 4.9* 4.2    Liver Function Tests: Recent Labs  Lab 10/13/17 0738 10/15/17 0818 10/17/17 0635  ALBUMIN 2.3* 2.6* 2.8*   No results for input(s): LIPASE, AMYLASE in the last 168 hours. No results for input(s): AMMONIA in the last 168 hours.  CBC: Recent Labs  Lab 10/13/17 0737 10/15/17 0818 10/17/17 0635  WBC 5.8 8.8 6.6  HGB 8.2* 8.6* 9.8*  HCT 26.5* 27.7* 31.0*  MCV 80.5 81.0 79.7  PLT 310 295 203    Cardiac Enzymes: No results for input(s): CKTOTAL, CKMB, CKMBINDEX, TROPONINI in the last 168 hours.  BNP: Invalid input(s): POCBNP  CBG: No results for input(s): GLUCAP in the last 168 hours.  Microbiology: No results found for this or any previous visit.  Coagulation Studies: No results for input(s): LABPROT, INR in the last 72 hours.  Urinalysis: No results for input(s): COLORURINE, LABSPEC, PHURINE, GLUCOSEU, HGBUR, BILIRUBINUR, KETONESUR, PROTEINUR, UROBILINOGEN, NITRITE, LEUKOCYTESUR in the last 72 hours.  Invalid input(s): APPERANCEUR    Imaging: No results  found.   Medications:       Assessment/ Plan:  67 y.o. male with a PMHx of end-stage renal disease on hemodialysis, hypertension, diabetes mellitus, coronary disease status post CABG, peripheral vascular disease status post left below the knee amputation, anemia of chronic kidney disease, and secondary hyperparathyroidism, who was admitted to [facility] on 08/23/2017 for ongoing management of respiratory failure, encephalopathy, and end-stage renal disease.   1.  ESRD on HD MWF.     Patient due for hemodialysis today.  Orders have been prepared.  2.  Anemia of chronic kidney disease.    Hemoglobin up to 9.8.  Continue epoetin 10,000 units IV with dialysis  3.  Secondary hyperparathyroidism.    Phosphorus 4.2 and acceptable.  Patient not on binders at the moment.  4.  Acute respiratory failure.   Patient appears to have stable respiratory status.  At times does try to pull on his tracheostomy however.   LOS: 0 Michael Moody 3/22/20199:04 AM

## 2017-10-20 DIAGNOSIS — N186 End stage renal disease: Secondary | ICD-10-CM | POA: Diagnosis not present

## 2017-10-20 DIAGNOSIS — J9621 Acute and chronic respiratory failure with hypoxia: Secondary | ICD-10-CM | POA: Diagnosis not present

## 2017-10-20 DIAGNOSIS — I2583 Coronary atherosclerosis due to lipid rich plaque: Secondary | ICD-10-CM | POA: Diagnosis not present

## 2017-10-20 DIAGNOSIS — I2510 Atherosclerotic heart disease of native coronary artery without angina pectoris: Secondary | ICD-10-CM | POA: Diagnosis not present

## 2017-10-20 DIAGNOSIS — Z992 Dependence on renal dialysis: Secondary | ICD-10-CM | POA: Diagnosis not present

## 2017-10-20 LAB — RENAL FUNCTION PANEL
Albumin: 2.8 g/dL — ABNORMAL LOW (ref 3.5–5.0)
Anion gap: 11 (ref 5–15)
BUN: 58 mg/dL — ABNORMAL HIGH (ref 6–20)
CHLORIDE: 98 mmol/L — AB (ref 101–111)
CO2: 25 mmol/L (ref 22–32)
CREATININE: 4.35 mg/dL — AB (ref 0.61–1.24)
Calcium: 8.8 mg/dL — ABNORMAL LOW (ref 8.9–10.3)
GFR, EST AFRICAN AMERICAN: 15 mL/min — AB (ref >60)
GFR, EST NON AFRICAN AMERICAN: 13 mL/min — AB (ref >60)
Glucose, Bld: 132 mg/dL — ABNORMAL HIGH (ref 65–99)
POTASSIUM: 3.8 mmol/L (ref 3.5–5.1)
Phosphorus: 3 mg/dL (ref 2.5–4.6)
Sodium: 134 mmol/L — ABNORMAL LOW (ref 135–145)

## 2017-10-20 LAB — CBC
HEMATOCRIT: 27.1 % — AB (ref 39.0–52.0)
Hemoglobin: 8.4 g/dL — ABNORMAL LOW (ref 13.0–17.0)
MCH: 24.9 pg — AB (ref 26.0–34.0)
MCHC: 31 g/dL (ref 30.0–36.0)
MCV: 80.2 fL (ref 78.0–100.0)
PLATELETS: 222 10*3/uL (ref 150–400)
RBC: 3.38 MIL/uL — ABNORMAL LOW (ref 4.22–5.81)
RDW: 19 % — AB (ref 11.5–15.5)
WBC: 6 10*3/uL (ref 4.0–10.5)

## 2017-10-20 NOTE — Progress Notes (Signed)
  [facility]  ROUNDING NOTE   Subjective:  Patient previously had a bed in [place] however this was lost. Patient still having dialysis on Monday, Wednesday, Friday schedule. Patient resting comfortably in bed at the moment.  Objective:  Vital signs in last 24 hours:  Temperature 97.5 pulse 97 respirations 14 blood pressure 92/67  Physical Exam: General: No acute distress  Head: [place]/AT OM moist  Eyes: Anicteric  Neck: Tracheostomy in place  Lungs:  Scattered rhonchi, normal effort  Heart: S1S2 no rubs  Abdomen:  Soft, nontender, bowel sounds present  Extremities: trace peripheral edema, L BKA  Neurologic: Awake follows simple commands  Skin: No lesions  Access: L IJ Permcath    Basic Metabolic Panel: Recent Labs  Lab 10/15/17 0818 10/17/17 0635 10/20/17 0818  NA 130* 130* 134*  K 4.0 4.2 3.8  CL 94* 95* 98*  CO2 25 20* 25  GLUCOSE 200* 153* 132*  BUN 74* 64* 58*  CREATININE 5.85* 5.51* 4.35*  CALCIUM 9.0 9.2 8.8*  PHOS 4.9* 4.2 3.0    Liver Function Tests: Recent Labs  Lab 10/15/17 0818 10/17/17 0635 10/20/17 0818  ALBUMIN 2.6* 2.8* 2.8*   No results for input(s): LIPASE, AMYLASE in the last 168 hours. No results for input(s): AMMONIA in the last 168 hours.  CBC: Recent Labs  Lab 10/15/17 0818 10/17/17 0635 10/20/17 0818  WBC 8.8 6.6 6.0  HGB 8.6* 9.8* 8.4*  HCT 27.7* 31.0* 27.1*  MCV 81.0 79.7 80.2  PLT 295 203 222    Cardiac Enzymes: No results for input(s): CKTOTAL, CKMB, CKMBINDEX, TROPONINI in the last 168 hours.  BNP: Invalid input(s): POCBNP  CBG: No results for input(s): GLUCAP in the last 168 hours.  Microbiology: No results found for this or any previous visit.  Coagulation Studies: No results for input(s): LABPROT, INR in the last 72 hours.  Urinalysis: No results for input(s): COLORURINE, LABSPEC, PHURINE, GLUCOSEU, HGBUR, BILIRUBINUR, KETONESUR, PROTEINUR, UROBILINOGEN, NITRITE, LEUKOCYTESUR in the last 72  hours.  Invalid input(s): APPERANCEUR    Imaging: No results found.   Medications:       Assessment/ Plan:  67 y.o. male with a PMHx of end-stage renal disease on hemodialysis, hypertension, diabetes mellitus, coronary disease status post CABG, peripheral vascular disease status post left below the knee amputation, anemia of chronic kidney disease, and secondary hyperparathyroidism, who was admitted to [facility] on 08/23/2017 for ongoing management of respiratory failure, encephalopathy, and end-stage renal disease.   1.  ESRD on HD MWF.     Continue dialysis on Monday, Wednesday, Friday schedule. Has been tolerating dialysis fairly well over the course of this hospitalization.  2.  Anemia of chronic kidney disease.    Hemoglobin currently 8.4. Maintain the patient on Epogen 10,000 is IV with dialysis.  3.  Secondary hyperparathyroidism.  Phosphorus at target at 3.0. Continue to monitor.  4.  Acute respiratory failure.   Tracheostomy functioning well. Being followed by pulmonology as well.   LOS: 0 Latacha Texeira 3/25/20194:21 PM

## 2017-10-20 NOTE — Progress Notes (Signed)
Pulmonary [place]   PULMONARY SERVICE  PROGRESS NOTE  Date of Service:  October 20, 2017  Michael Moody  SEG:[id number]  DOB: July 20, 1951   DOA: 09/08/2017  Referring Physician: Merton Border, MD  HPI: Michael Moody is a 67 y.o. male seen for follow up of Acute on Chronic Respiratory Failure.  Patient is on aerosolized T collar has been doing fairly well currently is not requiring any oxygen other than he medication.  Patient has not been tolerating capping however.  Issue was attempted capping and had increased work of breathing noted.  Having basically he is not going to be of the capping has failed now on several attempts.  Medications: Reviewed on Rounds  Physical Exam:  Vitals:   Temperature 98.6 degrees pulse 90 respiratory 21 blood pressure 113/60 saturations 98%  Ventilator Settings  Aerosolized T collar FiO2 21%  . General: Comfortable at this time . Eyes: Grossly normal lids, irises & conjunctiva . ENT: grossly tongue is normal . Neck: no obvious mass . Cardiovascular:  S1-S2 normal no gallop or rub . Respiratory:  No rhonchi expansion is equal bilaterally . Abdomen:  Soft nontender . Skin: no rash seen on limited exam . Musculoskeletal: not rigid . Psychiatric:unable to assess . Neurologic: no seizure no involuntary movements         Labs on Admission:  Basic Metabolic Panel: Recent Labs  Lab 10/15/17 0818 10/17/17 0635 10/20/17 0818  NA 130* 130* 134*  K 4.0 4.2 3.8  CL 94* 95* 98*  CO2 25 20* 25  GLUCOSE 200* 153* 132*  BUN 74* 64* 58*  CREATININE 5.85* 5.51* 4.35*  CALCIUM 9.0 9.2 8.8*  PHOS 4.9* 4.2 3.0    Liver Function Tests: Recent Labs  Lab 10/15/17 0818 10/17/17 0635 10/20/17 0818  ALBUMIN 2.6* 2.8* 2.8*   No results for input(s): LIPASE, AMYLASE in the last 168 hours. No results for input(s): AMMONIA in the last 168 hours.  CBC: Recent Labs  Lab 10/15/17 0818 10/17/17 0635  10/20/17 0818  WBC 8.8 6.6 6.0  HGB 8.6* 9.8* 8.4*  HCT 27.7* 31.0* 27.1*  MCV 81.0 79.7 80.2  PLT 295 203 222    Cardiac Enzymes: No results for input(s): CKTOTAL, CKMB, CKMBINDEX, TROPONINI in the last 168 hours.  BNP (last 3 results) No results for input(s): BNP in the last 8760 hours.  ProBNP (last 3 results) No results for input(s): PROBNP in the last 8760 hours.  Radiological Exams on Admission: No results found.  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  acute on chronic Respiratory failure hypoxia patient right now is on aerosolized T collar has failed attempted capping will continue with full support and I would probably consider placing the larger 2 in this time. 2. Coronary artery disease stable continue to monitor 3. End-stage renal disease on hemodialysis nephrology is following   I have personally seen and evaluated the patient, evaluated laboratory and imaging results, formulated the assessment and plan and placed orders. The Patient requires high complexity decision making for assessment and support.  Case was discussed on Rounds with the Respiratory Therapy Staff  Allyne Gee, MD [facility] Pulmonary Critical Care Medicine Sleep Medicine

## 2017-10-22 DIAGNOSIS — N186 End stage renal disease: Secondary | ICD-10-CM | POA: Diagnosis not present

## 2017-10-22 DIAGNOSIS — I2583 Coronary atherosclerosis due to lipid rich plaque: Secondary | ICD-10-CM | POA: Diagnosis not present

## 2017-10-22 DIAGNOSIS — J9621 Acute and chronic respiratory failure with hypoxia: Secondary | ICD-10-CM | POA: Diagnosis not present

## 2017-10-22 DIAGNOSIS — I2510 Atherosclerotic heart disease of native coronary artery without angina pectoris: Secondary | ICD-10-CM | POA: Diagnosis not present

## 2017-10-22 LAB — RENAL FUNCTION PANEL
Albumin: 2.5 g/dL — ABNORMAL LOW (ref 3.5–5.0)
Anion gap: 13 (ref 5–15)
BUN: 70 mg/dL — ABNORMAL HIGH (ref 6–20)
CALCIUM: 8.9 mg/dL (ref 8.9–10.3)
CO2: 22 mmol/L (ref 22–32)
CREATININE: 5.58 mg/dL — AB (ref 0.61–1.24)
Chloride: 96 mmol/L — ABNORMAL LOW (ref 101–111)
GFR calc Af Amer: 11 mL/min — ABNORMAL LOW (ref >60)
GFR calc non Af Amer: 9 mL/min — ABNORMAL LOW (ref >60)
Glucose, Bld: 186 mg/dL — ABNORMAL HIGH (ref 65–99)
Phosphorus: 3.5 mg/dL (ref 2.5–4.6)
Potassium: 4.1 mmol/L (ref 3.5–5.1)
SODIUM: 131 mmol/L — AB (ref 135–145)

## 2017-10-22 LAB — CBC
HCT: 28.3 % — ABNORMAL LOW (ref 39.0–52.0)
Hemoglobin: 8.9 g/dL — ABNORMAL LOW (ref 13.0–17.0)
MCH: 25.4 pg — AB (ref 26.0–34.0)
MCHC: 31.4 g/dL (ref 30.0–36.0)
MCV: 80.9 fL (ref 78.0–100.0)
PLATELETS: 193 10*3/uL (ref 150–400)
RBC: 3.5 MIL/uL — ABNORMAL LOW (ref 4.22–5.81)
RDW: 20.3 % — ABNORMAL HIGH (ref 11.5–15.5)
WBC: 8.9 10*3/uL (ref 4.0–10.5)

## 2017-10-22 NOTE — Progress Notes (Signed)
Pulmonary [place]   PULMONARY SERVICE  PROGRESS NOTE  Date of Service:  October 22, 2017  Michael Moody  WUX:[id number]  DOB: January 28, 1951   DOA: 09/08/2017  Referring Physician: Merton Border, MD  HPI: [name] is a 67 y.o. male seen for follow up of Acute on Chronic Respiratory Failure.  Patient's on aerosolized T collar secretions are copious currently is on room air for discharge  Medications: Reviewed on Rounds  Physical Exam:  Vitals:  Temperature 97.4 degrees pulse 72 respiratory 16 blood pressure 102/65 saturations 97%  Ventilator Settings  Off the ventilator on aerosolized T collar  . General: Comfortable at this time . Eyes: Grossly normal lids, irises & conjunctiva . ENT: grossly tongue is normal . Neck: no obvious mass . Cardiovascular:  S1-S2 normal no gallop . Respiratory:  No rhonchi expansion equal . Abdomen:  Soft nontender . Skin: no rash seen on limited exam . Musculoskeletal: not rigid . Psychiatric:unable to assess . Neurologic: no seizure no involuntary movements         Labs on Admission:  Basic Metabolic Panel: Recent Labs  Lab 10/17/17 0635 10/20/17 0818 10/22/17 0819  NA 130* 134* 131*  K 4.2 3.8 4.1  CL 95* 98* 96*  CO2 20* 25 22  GLUCOSE 153* 132* 186*  BUN 64* 58* 70*  CREATININE 5.51* 4.35* 5.58*  CALCIUM 9.2 8.8* 8.9  PHOS 4.2 3.0 3.5    Liver Function Tests: Recent Labs  Lab 10/17/17 0635 10/20/17 0818 10/22/17 0819  ALBUMIN 2.8* 2.8* 2.5*   No results for input(s): LIPASE, AMYLASE in the last 168 hours. No results for input(s): AMMONIA in the last 168 hours.  CBC: Recent Labs  Lab 10/17/17 0635 10/20/17 0818 10/22/17 0819  WBC 6.6 6.0 8.9  HGB 9.8* 8.4* 8.9*  HCT 31.0* 27.1* 28.3*  MCV 79.7 80.2 80.9  PLT 203 222 193    Cardiac Enzymes: No results for input(s): CKTOTAL, CKMB, CKMBINDEX, TROPONINI in the last 168 hours.  BNP (last 3 results) No results  for input(s): BNP in the last 8760 hours.  ProBNP (last 3 results) No results for input(s): PROBNP in the last 8760 hours.  Radiological Exams on Admission: No results found.  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  acute on chronic Respiratory failure with hypoxia doing fine at baseline on aerosolized T collar continue with supportive care titrate oxygen continue pulmonary toilet 2. Coronary artery disease stable pain-free  3. End-stage renal disease on dialysis nephrology is following   I have personally seen and evaluated the patient, evaluated laboratory and imaging results, formulated the assessment and plan and placed orders. The Patient requires high complexity decision making for assessment and support.  Case was discussed on Rounds with the Respiratory Therapy Staff  Allyne Gee, MD [facility] Pulmonary Critical Care Medicine Sleep Medicine

## 2017-10-22 NOTE — Progress Notes (Signed)
  [facility]  ROUNDING NOTE   Subjective:  Patient seen and evaluated during hemodialysis. Appears to be tolerating well.   Objective:  Vital signs in last 24 hours:  Temperature 97.8 pulse 89 respirations 18 blood pressure 101/58  Physical Exam: General: No acute distress  Head: [place]/AT OM moist  Eyes: Anicteric  Neck: Tracheostomy in place  Lungs:  Scattered rhonchi, normal effort  Heart: S1S2 no rubs  Abdomen:  Soft, nontender, bowel sounds present  Extremities: trace peripheral edema, L BKA  Neurologic: Awake follows simple commands  Skin: No lesions  Access: L IJ Permcath    Basic Metabolic Panel: Recent Labs  Lab 10/17/17 0635 10/20/17 0818 10/22/17 0819  NA 130* 134* 131*  K 4.2 3.8 4.1  CL 95* 98* 96*  CO2 20* 25 22  GLUCOSE 153* 132* 186*  BUN 64* 58* 70*  CREATININE 5.51* 4.35* 5.58*  CALCIUM 9.2 8.8* 8.9  PHOS 4.2 3.0 3.5    Liver Function Tests: Recent Labs  Lab 10/17/17 0635 10/20/17 0818 10/22/17 0819  ALBUMIN 2.8* 2.8* 2.5*   No results for input(s): LIPASE, AMYLASE in the last 168 hours. No results for input(s): AMMONIA in the last 168 hours.  CBC: Recent Labs  Lab 10/17/17 0635 10/20/17 0818 10/22/17 0819  WBC 6.6 6.0 8.9  HGB 9.8* 8.4* 8.9*  HCT 31.0* 27.1* 28.3*  MCV 79.7 80.2 80.9  PLT 203 222 193    Cardiac Enzymes: No results for input(s): CKTOTAL, CKMB, CKMBINDEX, TROPONINI in the last 168 hours.  BNP: Invalid input(s): POCBNP  CBG: No results for input(s): GLUCAP in the last 168 hours.  Microbiology: No results found for this or any previous visit.  Coagulation Studies: No results for input(s): LABPROT, INR in the last 72 hours.  Urinalysis: No results for input(s): COLORURINE, LABSPEC, PHURINE, GLUCOSEU, HGBUR, BILIRUBINUR, KETONESUR, PROTEINUR, UROBILINOGEN, NITRITE, LEUKOCYTESUR in the last 72 hours.  Invalid input(s): APPERANCEUR    Imaging: No results found.   Medications:        Assessment/ Plan:  67 y.o. male with a PMHx of end-stage renal disease on hemodialysis, hypertension, diabetes mellitus, coronary disease status post CABG, peripheral vascular disease status post left below the knee amputation, anemia of chronic kidney disease, and secondary hyperparathyroidism, who was admitted to [facility] on 08/23/2017 for ongoing management of respiratory failure, encephalopathy, and end-stage renal disease.   1.  ESRD on HD MWF.     Patient seen and evaluated during hemodialysis.  Ultrafiltration target 1.5 kg.  Thereafter next dialysis on Friday.  2.  Anemia of chronic kidney disease.    Hemoglobin up to 8.9.  Maintain the patient on epoetin 10,000 units IV with dialysis.  3.  Secondary hyperparathyroidism.    Phosphorus rechecked today and at target at 3.5.  Recheck on Friday.  4.  Acute respiratory failure.   Patient did not tolerate capping of his tracheostomy.  Continue current trach care.   LOS: 0 Shirl Ludington 3/27/20193:50 PM

## 2017-10-23 DIAGNOSIS — I2510 Atherosclerotic heart disease of native coronary artery without angina pectoris: Secondary | ICD-10-CM | POA: Diagnosis not present

## 2017-10-23 DIAGNOSIS — I2583 Coronary atherosclerosis due to lipid rich plaque: Secondary | ICD-10-CM | POA: Diagnosis not present

## 2017-10-23 DIAGNOSIS — J9621 Acute and chronic respiratory failure with hypoxia: Secondary | ICD-10-CM | POA: Diagnosis not present

## 2017-10-23 DIAGNOSIS — N186 End stage renal disease: Secondary | ICD-10-CM | POA: Diagnosis not present

## 2017-10-23 NOTE — Progress Notes (Signed)
Pulmonary [place]   PULMONARY SERVICE  PROGRESS NOTE  Date of Service:  October 23, 2017  Choice Kleinsasser  LKG:[id number]  DOB: 1950-09-16   DOA: 09/08/2017  Referring Physician: Merton Border, MD  HPI: Michael Moody is a 67 y.o. male seen for follow up of Acute on Chronic Respiratory Failure.  Remains on aerosolized T collar his own been on room air no distress is noted at this time.  Medications: Reviewed on Rounds  Physical Exam:  Vitals:   Temperature 98.4 degrees pulse 79 respiratory 21 blood pressure 110/41 saturations 95%  Ventilator Settings  Aerosolized T collar 21%  . General: Comfortable at this time . Eyes: Grossly normal lids, irises & conjunctiva . ENT: grossly tongue is normal . Neck: no obvious mass . Cardiovascular:  S1-S2 normal no gallop . Respiratory:  clear . Abdomen:  Soft no tenderness . Skin: no rash seen on limited exam . Musculoskeletal: not rigid . Psychiatric:unable to assess . Neurologic: no seizure no involuntary movements         Labs on Admission:  Basic Metabolic Panel: Recent Labs  Lab 10/17/17 0635 10/20/17 0818 10/22/17 0819  NA 130* 134* 131*  K 4.2 3.8 4.1  CL 95* 98* 96*  CO2 20* 25 22  GLUCOSE 153* 132* 186*  BUN 64* 58* 70*  CREATININE 5.51* 4.35* 5.58*  CALCIUM 9.2 8.8* 8.9  PHOS 4.2 3.0 3.5    Liver Function Tests: Recent Labs  Lab 10/17/17 0635 10/20/17 0818 10/22/17 0819  ALBUMIN 2.8* 2.8* 2.5*   No results for input(s): LIPASE, AMYLASE in the last 168 hours. No results for input(s): AMMONIA in the last 168 hours.  CBC: Recent Labs  Lab 10/17/17 0635 10/20/17 0818 10/22/17 0819  WBC 6.6 6.0 8.9  HGB 9.8* 8.4* 8.9*  HCT 31.0* 27.1* 28.3*  MCV 79.7 80.2 80.9  PLT 203 222 193    Cardiac Enzymes: No results for input(s): CKTOTAL, CKMB, CKMBINDEX, TROPONINI in the last 168 hours.  BNP (last 3 results) No results for input(s): BNP in the last 8760  hours.  ProBNP (last 3 results) No results for input(s): PROBNP in the last 8760 hours.  Radiological Exams on Admission: No results found.  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  acute on chronic Respiratory failure with hypoxia continue with full supportive care currently on aerosolized T collar we will continue with pulmonary toilet secretion management 2. End-stage renal disease on dialysis awaiting discharge planning will continue to follow along 3. Coronary artery disease stable.  Remains pain-free   I have personally seen and evaluated the patient, evaluated laboratory and imaging results, formulated the assessment and plan and placed orders. The Patient requires high complexity decision making for assessment and support.  Case was discussed on Rounds with the Respiratory Therapy Staff  Allyne Gee, MD [facility] Pulmonary Critical Care Medicine Sleep Medicine

## 2017-10-24 DIAGNOSIS — I2583 Coronary atherosclerosis due to lipid rich plaque: Secondary | ICD-10-CM | POA: Diagnosis not present

## 2017-10-24 DIAGNOSIS — N186 End stage renal disease: Secondary | ICD-10-CM | POA: Diagnosis not present

## 2017-10-24 DIAGNOSIS — I2510 Atherosclerotic heart disease of native coronary artery without angina pectoris: Secondary | ICD-10-CM | POA: Diagnosis not present

## 2017-10-24 DIAGNOSIS — J9621 Acute and chronic respiratory failure with hypoxia: Secondary | ICD-10-CM | POA: Diagnosis not present

## 2017-10-24 LAB — CBC
HEMATOCRIT: 28.7 % — AB (ref 39.0–52.0)
Hemoglobin: 8.6 g/dL — ABNORMAL LOW (ref 13.0–17.0)
MCH: 24.4 pg — ABNORMAL LOW (ref 26.0–34.0)
MCHC: 30 g/dL (ref 30.0–36.0)
MCV: 81.3 fL (ref 78.0–100.0)
Platelets: 225 10*3/uL (ref 150–400)
RBC: 3.53 MIL/uL — ABNORMAL LOW (ref 4.22–5.81)
RDW: 19.6 % — AB (ref 11.5–15.5)
WBC: 18 10*3/uL — ABNORMAL HIGH (ref 4.0–10.5)

## 2017-10-24 LAB — RENAL FUNCTION PANEL
Albumin: 2.2 g/dL — ABNORMAL LOW (ref 3.5–5.0)
Anion gap: 11 (ref 5–15)
BUN: 70 mg/dL — AB (ref 6–20)
CHLORIDE: 94 mmol/L — AB (ref 101–111)
CO2: 25 mmol/L (ref 22–32)
CREATININE: 5.79 mg/dL — AB (ref 0.61–1.24)
Calcium: 8.7 mg/dL — ABNORMAL LOW (ref 8.9–10.3)
GFR calc Af Amer: 11 mL/min — ABNORMAL LOW (ref >60)
GFR calc non Af Amer: 9 mL/min — ABNORMAL LOW (ref >60)
GLUCOSE: 160 mg/dL — AB (ref 65–99)
POTASSIUM: 3.6 mmol/L (ref 3.5–5.1)
Phosphorus: 2.4 mg/dL — ABNORMAL LOW (ref 2.5–4.6)
Sodium: 130 mmol/L — ABNORMAL LOW (ref 135–145)

## 2017-10-24 NOTE — Progress Notes (Signed)
Pulmonary [place]   PULMONARY SERVICE  PROGRESS NOTE  Date of Service: October 24, 2017  Michael Moody  IBB:[id number]  DOB: June 22, 1951   DOA: 09/08/2017  Referring Physician: Merton Border, MD  HPI: Michael Moody is a 67 y.o. male seen for follow up of Acute on Chronic Respiratory Failure.  T-collar has been doing fairly well patient's been on room air without distress  Medications: Reviewed on Rounds  Physical Exam:  Vitals: Temperature 97.0 pulse 83 respiratory rate 17 blood pressure 123/61 saturations 97%  Ventilator Settings off the ventilator on T collar  . General: Comfortable at this time . Eyes: Grossly normal lids, irises & conjunctiva . ENT: grossly tongue is normal . Neck: no obvious mass . Cardiovascular: S1-S2 normal no gallop . Respiratory: Clear . Abdomen: Soft nontender . Skin: no rash seen on limited exam . Musculoskeletal: not rigid . Psychiatric:unable to assess . Neurologic: no seizure no involuntary movements         Labs on Admission:  Basic Metabolic Panel: Recent Labs  Lab 10/20/17 0818 10/22/17 0819 10/24/17 0745  NA 134* 131* 130*  K 3.8 4.1 3.6  CL 98* 96* 94*  CO2 25 22 25   GLUCOSE 132* 186* 160*  BUN 58* 70* 70*  CREATININE 4.35* 5.58* 5.79*  CALCIUM 8.8* 8.9 8.7*  PHOS 3.0 3.5 2.4*    Liver Function Tests: Recent Labs  Lab 10/20/17 0818 10/22/17 0819 10/24/17 0745  ALBUMIN 2.8* 2.5* 2.2*   No results for input(s): LIPASE, AMYLASE in the last 168 hours. No results for input(s): AMMONIA in the last 168 hours.  CBC: Recent Labs  Lab 10/20/17 0818 10/22/17 0819  WBC 6.0 8.9  HGB 8.4* 8.9*  HCT 27.1* 28.3*  MCV 80.2 80.9  PLT 222 193    Cardiac Enzymes: No results for input(s): CKTOTAL, CKMB, CKMBINDEX, TROPONINI in the last 168 hours.  BNP (last 3 results) No results for input(s): BNP in the last 8760 hours.  ProBNP (last 3 results) No results for input(s):  PROBNP in the last 8760 hours.  Radiological Exams on Admission: No results found.  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 Acute on chronic respiratory failure with hypoxia at this time patient is off the ventilator has been on 21% oxygen doing fairly well will continue with present supportive care 2. Coronary artery disease at baseline 3. Nephrology following for end-stage renal dialysis   I have personally seen and evaluated the patient, evaluated laboratory and imaging results, formulated the assessment and plan and placed orders. The Patient requires high complexity decision making for assessment and support.  Case was discussed on Rounds with the Respiratory Therapy Staff  Allyne Gee, MD [facility] Pulmonary Critical Care Medicine Sleep Medicine

## 2017-10-24 NOTE — Progress Notes (Signed)
  [facility]  ROUNDING NOTE   Subjective:  Patient due for hemodialysis today. He is working currently with physical therapy to sit up in a chair.   Objective:  Vital signs in last 24 hours:  Temperature 98.9 pulse 79 respirations 20 blood pressure 110/41  Physical Exam: General: No acute distress  Head: [place]/AT OM moist  Eyes: Anicteric  Neck: Tracheostomy in place  Lungs:  Scattered rhonchi, normal effort  Heart: S1S2 no rubs  Abdomen:  Soft, nontender, bowel sounds present  Extremities: trace peripheral edema, L BKA  Neurologic: Awake follows simple commands  Skin: No lesions  Access: L IJ Permcath    Basic Metabolic Panel: Recent Labs  Lab 10/20/17 0818 10/22/17 0819 10/24/17 0745  NA 134* 131* 130*  K 3.8 4.1 3.6  CL 98* 96* 94*  CO2 25 22 25   GLUCOSE 132* 186* 160*  BUN 58* 70* 70*  CREATININE 4.35* 5.58* 5.79*  CALCIUM 8.8* 8.9 8.7*  PHOS 3.0 3.5 2.4*    Liver Function Tests: Recent Labs  Lab 10/20/17 0818 10/22/17 0819 10/24/17 0745  ALBUMIN 2.8* 2.5* 2.2*   No results for input(s): LIPASE, AMYLASE in the last 168 hours. No results for input(s): AMMONIA in the last 168 hours.  CBC: Recent Labs  Lab 10/20/17 0818 10/22/17 0819  WBC 6.0 8.9  HGB 8.4* 8.9*  HCT 27.1* 28.3*  MCV 80.2 80.9  PLT 222 193    Cardiac Enzymes: No results for input(s): CKTOTAL, CKMB, CKMBINDEX, TROPONINI in the last 168 hours.  BNP: Invalid input(s): POCBNP  CBG: No results for input(s): GLUCAP in the last 168 hours.  Microbiology: No results found for this or any previous visit.  Coagulation Studies: No results for input(s): LABPROT, INR in the last 72 hours.  Urinalysis: No results for input(s): COLORURINE, LABSPEC, PHURINE, GLUCOSEU, HGBUR, BILIRUBINUR, KETONESUR, PROTEINUR, UROBILINOGEN, NITRITE, LEUKOCYTESUR in the last 72 hours.  Invalid input(s): APPERANCEUR    Imaging: No results found.   Medications:       Assessment/  Plan:  67 y.o. male with a PMHx of end-stage renal disease on hemodialysis, hypertension, diabetes mellitus, coronary disease status post CABG, peripheral vascular disease status post left below the knee amputation, anemia of chronic kidney disease, and secondary hyperparathyroidism, who was admitted to [facility] on 08/23/2017 for ongoing management of respiratory failure, encephalopathy, and end-stage renal disease.   1.  ESRD on HD MWF.     Patient due for hemodialysis today.  Orders have been prepared.  Continue dialysis on MWF schedule.  2.  Anemia of chronic kidney disease.    Continue to monitor hemoglobin.  We will continue epoetin 10,000 units IV with dialysis.  3.  Secondary hyperparathyroidism.    Phosphorus 2.4 this a.m.  Continue to monitor.  4.  Acute respiratory failure.   Tracheostomy functioning well.  Breathing comfortably at the moment.   LOS: 0 Michael Moody 3/29/20191:56 PM

## 2017-10-25 ENCOUNTER — Other Ambulatory Visit (HOSPITAL_COMMUNITY): Payer: Medicaid Other

## 2017-10-26 DIAGNOSIS — N186 End stage renal disease: Secondary | ICD-10-CM | POA: Diagnosis not present

## 2017-10-26 DIAGNOSIS — I2583 Coronary atherosclerosis due to lipid rich plaque: Secondary | ICD-10-CM | POA: Diagnosis not present

## 2017-10-26 DIAGNOSIS — J9621 Acute and chronic respiratory failure with hypoxia: Secondary | ICD-10-CM | POA: Diagnosis not present

## 2017-10-26 DIAGNOSIS — I2510 Atherosclerotic heart disease of native coronary artery without angina pectoris: Secondary | ICD-10-CM | POA: Diagnosis not present

## 2017-10-26 NOTE — Progress Notes (Signed)
Pulmonary [place]   PULMONARY SERVICE  PROGRESS NOTE  Date of Service: October 26, 2017  Kailen Hinkle  ZOX:[id number]  DOB: [birth year]   DOA: 09/08/2017  Referring Physician: Merton Border, MD  HPI: Khadim Lundberg is a 67 y.o. male seen for follow up of Acute on Chronic Respiratory Failure.  Remains comfortable on T collar at this time patient is on room air  Medications: Reviewed on Rounds  Physical Exam:  Vitals: Temperature 100.1 pulse 78 respiratory rate 17 blood pressure 113/59 saturations 96%  Ventilator Settings off the ventilator on T collar  . General: Comfortable at this time . Eyes: Grossly normal lids, irises & conjunctiva . ENT: grossly tongue is normal . Neck: no obvious mass . Cardiovascular: S1-S2 normal no gallop . Respiratory: Clear . Abdomen: Soft nondistended . Skin: no rash seen on limited exam . Musculoskeletal: not rigid . Psychiatric:unable to assess . Neurologic: no seizure no involuntary movements         Labs on Admission:  Basic Metabolic Panel: Recent Labs  Lab 10/20/17 0818 10/22/17 0819 10/24/17 0745  NA 134* 131* 130*  K 3.8 4.1 3.6  CL 98* 96* 94*  CO2 25 22 25   GLUCOSE 132* 186* 160*  BUN 58* 70* 70*  CREATININE 4.35* 5.58* 5.79*  CALCIUM 8.8* 8.9 8.7*  PHOS 3.0 3.5 2.4*    Liver Function Tests: Recent Labs  Lab 10/20/17 0818 10/22/17 0819 10/24/17 0745  ALBUMIN 2.8* 2.5* 2.2*   No results for input(s): LIPASE, AMYLASE in the last 168 hours. No results for input(s): AMMONIA in the last 168 hours.  CBC: Recent Labs  Lab 10/20/17 0818 10/22/17 0819 10/24/17 1503  WBC 6.0 8.9 18.0*  HGB 8.4* 8.9* 8.6*  HCT 27.1* 28.3* 28.7*  MCV 80.2 80.9 81.3  PLT 222 193 225    Cardiac Enzymes: No results for input(s): CKTOTAL, CKMB, CKMBINDEX, TROPONINI in the last 168 hours.  BNP (last 3 results) No results for input(s): BNP in the last 8760 hours.  ProBNP (last 3  results) No results for input(s): PROBNP in the last 8760 hours.  Radiological Exams on Admission: Dg Chest Port 1 View  Result Date: 10/25/2017 CLINICAL DATA:  Fevers. EXAM: PORTABLE CHEST 1 VIEW COMPARISON:  10/09/2017 FINDINGS: The tracheostomy tube tip is above the carina. Right axillary and subclavian vein vascular stents identified. There is a left-sided dialysis catheter with tips in the right atrium. Asymmetric elevation of the right hemidiaphragm. No pleural effusions or edema. No airspace opacities. IMPRESSION: 1. No active disease. 2. Asymmetric elevation of right hemidiaphragm. Electronically Signed   By: Kerby Moors M.D.   On: 10/25/2017 09:17    Assessment/Plan Active Problems:   Acute on chronic respiratory failure with hypoxia (HCC)   ESRD (end stage renal disease) on dialysis ([place])   DM (diabetes mellitus) type II controlled with renal manifestation (HCC)   CAD (coronary artery disease)   1. Acute on chronic history failure with hypoxia patient doing well on T collar at baseline 2. End-stage renal disease on dialysis followed by nephrology 3. Coronary artery disease stable no active chest pain   I have personally seen and evaluated the patient, evaluated laboratory and imaging results, formulated the assessment and plan and placed orders. The Patient requires high complexity decision making for assessment and support.  Case was discussed on Rounds with the Respiratory Therapy Staff  Allyne Gee, MD [facility] Pulmonary Critical Care Medicine Sleep Medicine

## 2017-10-27 DIAGNOSIS — I2510 Atherosclerotic heart disease of native coronary artery without angina pectoris: Secondary | ICD-10-CM | POA: Diagnosis not present

## 2017-10-27 DIAGNOSIS — N186 End stage renal disease: Secondary | ICD-10-CM | POA: Diagnosis not present

## 2017-10-27 DIAGNOSIS — I2583 Coronary atherosclerosis due to lipid rich plaque: Secondary | ICD-10-CM | POA: Diagnosis not present

## 2017-10-27 DIAGNOSIS — J9621 Acute and chronic respiratory failure with hypoxia: Secondary | ICD-10-CM | POA: Diagnosis not present

## 2017-10-27 LAB — CBC
HCT: 27.1 % — ABNORMAL LOW (ref 39.0–52.0)
Hemoglobin: 8.8 g/dL — ABNORMAL LOW (ref 13.0–17.0)
MCH: 25.7 pg — ABNORMAL LOW (ref 26.0–34.0)
MCHC: 32.5 g/dL (ref 30.0–36.0)
MCV: 79 fL (ref 78.0–100.0)
PLATELETS: 273 10*3/uL (ref 150–400)
RBC: 3.43 MIL/uL — ABNORMAL LOW (ref 4.22–5.81)
RDW: 20.1 % — ABNORMAL HIGH (ref 11.5–15.5)
WBC: 19.2 10*3/uL — AB (ref 4.0–10.5)

## 2017-10-27 LAB — RENAL FUNCTION PANEL
Albumin: 2.3 g/dL — ABNORMAL LOW (ref 3.5–5.0)
Anion gap: 14 (ref 5–15)
BUN: 89 mg/dL — ABNORMAL HIGH (ref 6–20)
CALCIUM: 8.7 mg/dL — AB (ref 8.9–10.3)
CHLORIDE: 98 mmol/L — AB (ref 101–111)
CO2: 19 mmol/L — ABNORMAL LOW (ref 22–32)
CREATININE: 6.61 mg/dL — AB (ref 0.61–1.24)
GFR, EST AFRICAN AMERICAN: 9 mL/min — AB (ref >60)
GFR, EST NON AFRICAN AMERICAN: 8 mL/min — AB (ref >60)
Glucose, Bld: 160 mg/dL — ABNORMAL HIGH (ref 65–99)
Phosphorus: 2.6 mg/dL (ref 2.5–4.6)
Potassium: 6 mmol/L — ABNORMAL HIGH (ref 3.5–5.1)
Sodium: 131 mmol/L — ABNORMAL LOW (ref 135–145)

## 2017-10-27 LAB — CULTURE, RESPIRATORY W GRAM STAIN

## 2017-10-27 LAB — CULTURE, RESPIRATORY

## 2017-10-27 NOTE — Progress Notes (Signed)
[facility] [place]   PULMONARY SERVICE  PROGRESS NOTE  Date of Service:   October 27, 2017  Michael Moody  WUJ:[id number]  DOB: Nov 10, 1950   DOA: 09/08/2017  Referring Physician: Merton Border, MD  HPI: Michael Moody is a 67 y.o. male seen for follow up of Acute on Chronic Respiratory Failure.  Remains on aerosolized T-piece at this time patient is on 21% oxygen no distress.  Patient has been tolerating the T collar very well.  Continues with dialysis as prescribed  Medications: Reviewed on Rounds  Physical Exam:  Vitals:  Temperature 98.0 degrees pulse 97 respiratory 27 blood pressure 105/66 saturations 98%  Ventilator Settings  Aerosolized T collar 21%  . General: Comfortable at this time . Eyes: Grossly normal lids, irises & conjunctiva . ENT: grossly tongue is normal . Neck: no obvious mass . Cardiovascular:  S1-S2 normal regular rhythm . Respiratory:  No rhonchi expansion equal . Abdomen:  Soft nontender . Skin: no rash seen on limited exam . Musculoskeletal: not rigid . Psychiatric:unable to assess . Neurologic: no seizure no involuntary movements         Labs on Admission:  Basic Metabolic Panel: Recent Labs  Lab 10/22/17 0819 10/24/17 0745 10/27/17 0615  NA 131* 130* 131*  K 4.1 3.6 6.0*  CL 96* 94* 98*  CO2 22 25 19*  GLUCOSE 186* 160* 160*  BUN 70* 70* 89*  CREATININE 5.58* 5.79* 6.61*  CALCIUM 8.9 8.7* 8.7*  PHOS 3.5 2.4* 2.6    Liver Function Tests: Recent Labs  Lab 10/22/17 0819 10/24/17 0745 10/27/17 0615  ALBUMIN 2.5* 2.2* 2.3*   No results for input(s): LIPASE, AMYLASE in the last 168 hours. No results for input(s): AMMONIA in the last 168 hours.  CBC: Recent Labs  Lab 10/22/17 0819 10/24/17 1503 10/27/17 0615  WBC 8.9 18.0* 19.2*  HGB 8.9* 8.6* 8.8*  HCT 28.3* 28.7* 27.1*  MCV 80.9 81.3 79.0  PLT 193 225 273    Cardiac Enzymes: No results for input(s): CKTOTAL, CKMB,  CKMBINDEX, TROPONINI in the last 168 hours.  BNP (last 3 results) No results for input(s): BNP in the last 8760 hours.  ProBNP (last 3 results) No results for input(s): PROBNP in the last 8760 hours.  Radiological Exams on Admission: Dg Chest Port 1 View  Result Date: 10/25/2017 CLINICAL DATA:  Fevers. EXAM: PORTABLE CHEST 1 VIEW COMPARISON:  10/09/2017 FINDINGS: The tracheostomy tube tip is above the carina. Right axillary and subclavian vein vascular stents identified. There is a left-sided dialysis catheter with tips in the right atrium. Asymmetric elevation of the right hemidiaphragm. No pleural effusions or edema. No airspace opacities. IMPRESSION: 1. No active disease. 2. Asymmetric elevation of right hemidiaphragm. Electronically Signed   By: Kerby Moors M.D.   On: 10/25/2017 09:17    Assessment/Plan Active Problems:   Acute on chronic respiratory failure with hypoxia (HCC)   ESRD (end stage renal disease) on dialysis ([place])   DM (diabetes mellitus) type II controlled with renal manifestation (HCC)   CAD (coronary artery disease)   1.  acute on chronic Respiratory failure with hypoxia patient is at baseline on aerosolized T collar will continue with pulmonary toilet secretion management supportive care patient is not able to tolerate smaller size trach or capping 2. End-stage renal disease on dialysis followed by Nephrology 3. Diabetes mellitus with renal failure  4. Coronary artery disease stable at this time we will continue to monitor   I  have personally seen and evaluated the patient, evaluated laboratory and imaging results, formulated the assessment and plan and placed orders. The Patient requires high complexity decision making for assessment and support.  Case was discussed on Rounds with the Respiratory Therapy Staff  Allyne Gee, MD [facility] Pulmonary Critical Care Medicine Sleep Medicine

## 2017-10-27 NOTE — Progress Notes (Signed)
[facility]  ROUNDING NOTE   Subjective:  She was seen and evaluated during hemodialysis. He experienced a fall over the weekend. He is now back in restraints.   Objective:  Vital signs in last 24 hours:  Temperature 98.5 pulse 90 respirations 20 blood pressure 139/76  Physical Exam: General: No acute distress  Head: [place]/AT OM moist  Eyes: Anicteric  Neck: Tracheostomy in place  Lungs:  Scattered rhonchi, normal effort  Heart: S1S2 no rubs  Abdomen:  Soft, nontender, bowel sounds present  Extremities: trace peripheral edema, L BKA  Neurologic: Awake follows simple commands  Skin: No lesions  Access: L IJ Permcath    Basic Metabolic Panel: Recent Labs  Lab 10/22/17 0819 10/24/17 0745 10/27/17 0615  NA 131* 130* 131*  K 4.1 3.6 6.0*  CL 96* 94* 98*  CO2 22 25 19*  GLUCOSE 186* 160* 160*  BUN 70* 70* 89*  CREATININE 5.58* 5.79* 6.61*  CALCIUM 8.9 8.7* 8.7*  PHOS 3.5 2.4* 2.6    Liver Function Tests: Recent Labs  Lab 10/22/17 0819 10/24/17 0745 10/27/17 0615  ALBUMIN 2.5* 2.2* 2.3*   No results for input(s): LIPASE, AMYLASE in the last 168 hours. No results for input(s): AMMONIA in the last 168 hours.  CBC: Recent Labs  Lab 10/22/17 0819 10/24/17 1503 10/27/17 0615  WBC 8.9 18.0* 19.2*  HGB 8.9* 8.6* 8.8*  HCT 28.3* 28.7* 27.1*  MCV 80.9 81.3 79.0  PLT 193 225 273    Cardiac Enzymes: No results for input(s): CKTOTAL, CKMB, CKMBINDEX, TROPONINI in the last 168 hours.  BNP: Invalid input(s): POCBNP  CBG: No results for input(s): GLUCAP in the last 168 hours.  Microbiology: Results for orders placed or performed during the hospital encounter of 09/08/17  Culture, blood (routine x 2)     Status: None (Preliminary result)   Collection Time: 10/23/17 12:30 AM  Result Value Ref Range Status   Specimen Description BLOOD RIGHT HAND  Final   Special Requests   Final    BOTTLES DRAWN AEROBIC AND ANAEROBIC Blood Culture adequate volume    Culture   Final    NO GROWTH 4 DAYS Performed at [facility], [place] [address] [place], [place] [address]    Report Status PENDING  Incomplete  Culture, blood (routine x 2)     Status: None (Preliminary result)   Collection Time: 10/23/17 12:32 AM  Result Value Ref Range Status   Specimen Description BLOOD RIGHT HAND  Final   Special Requests   Final    BOTTLES DRAWN AEROBIC ONLY Blood Culture adequate volume   Culture   Final    NO GROWTH 4 DAYS Performed at [facility], [place] [address] [place], [place] [address]    Report Status PENDING  Incomplete  Culture, respiratory (NON-Expectorated)     Status: None   Collection Time: 10/25/17  3:20 PM  Result Value Ref Range Status   Specimen Description TRACHEAL ASPIRATE  Final   Special Requests NONE  Final   Gram Stain   Final    ABUNDANT WBC PRESENT,BOTH PMN AND MONONUCLEAR FEW SQUAMOUS EPITHELIAL CELLS PRESENT ABUNDANT GRAM POSITIVE COCCI IN PAIRS MODERATE GRAM POSITIVE RODS MODERATE GRAM NEGATIVE RODS Performed at [facility], [place] [address], [place], [place] [address]    Culture MODERATE PSEUDOMONAS AERUGINOSA  Final   Report Status 10/27/2017 FINAL  Final   Organism ID, Bacteria PSEUDOMONAS AERUGINOSA  Final      Susceptibility  Pseudomonas aeruginosa - MIC*    CEFTAZIDIME 2 SENSITIVE Sensitive     CIPROFLOXACIN <=0.25 SENSITIVE Sensitive     GENTAMICIN 2 SENSITIVE Sensitive     IMIPENEM 2 SENSITIVE Sensitive     PIP/TAZO 8 SENSITIVE Sensitive     CEFEPIME 2 SENSITIVE Sensitive     * MODERATE PSEUDOMONAS AERUGINOSA    Coagulation Studies: No results for input(s): LABPROT, INR in the last 72 hours.  Urinalysis: No results for input(s): COLORURINE, LABSPEC, PHURINE, GLUCOSEU, HGBUR, BILIRUBINUR, KETONESUR, PROTEINUR, UROBILINOGEN, NITRITE, LEUKOCYTESUR in the last 72 hours.  Invalid input(s): APPERANCEUR    Imaging: No results found.   Medications:       Assessment/ Plan:  67 y.o.  male with a PMHx of end-stage renal disease on hemodialysis, hypertension, diabetes mellitus, coronary disease status post CABG, peripheral vascular disease status post left below the knee amputation, anemia of chronic kidney disease, and secondary hyperparathyroidism, who was admitted to [facility] on 08/23/2017 for ongoing management of respiratory failure, encephalopathy, and end-stage renal disease.   1.  ESRD on HD MWF.     Patient seen and evaluated during hemodialysis.  Appears to be tolerating well.  Continue dialysis on MWF schedule.  2.  Anemia of chronic kidney disease.    Hemoglobin stable at 8.8.  Maintain the patient on potent as prescribed.  3.  Secondary hyperparathyroidism.    Phosphorus up to 2.6 this a.m.  Repeat on Wednesday.  4.  Acute respiratory failure.   Patient has done well after weaning from the ventilator.  Tracheostomy in place.  5.  Hyperkalemia.  Serum potassium was high this a.m. at 6.0.  To be treated with dialysis.   LOS: 0 Michael Moody 4/1/20193:19 PM

## 2017-10-28 DIAGNOSIS — J9621 Acute and chronic respiratory failure with hypoxia: Secondary | ICD-10-CM | POA: Diagnosis not present

## 2017-10-28 DIAGNOSIS — N186 End stage renal disease: Secondary | ICD-10-CM | POA: Diagnosis not present

## 2017-10-28 DIAGNOSIS — I2510 Atherosclerotic heart disease of native coronary artery without angina pectoris: Secondary | ICD-10-CM | POA: Diagnosis not present

## 2017-10-28 DIAGNOSIS — I2583 Coronary atherosclerosis due to lipid rich plaque: Secondary | ICD-10-CM | POA: Diagnosis not present

## 2017-10-28 LAB — CULTURE, BLOOD (ROUTINE X 2)
CULTURE: NO GROWTH
Culture: NO GROWTH
SPECIAL REQUESTS: ADEQUATE
SPECIAL REQUESTS: ADEQUATE

## 2017-10-28 NOTE — Progress Notes (Signed)
Pulmonary [place]   PULMONARY SERVICE  PROGRESS NOTE  Date of Service: October 28, 2017  Michael Moody  TGG:[id number]  DOB: 1950-09-28   DOA: 09/08/2017  Referring Physician: Merton Border, MD  HPI: Michael Moody is a 67 y.o. male seen for follow up of Acute on Chronic Respiratory Failure.  Patient remains on aerosolized T collar has been on room air secretions are fair to moderate.  Patient still remains agitated requiring restraints  Medications: Reviewed on Rounds  Physical Exam:  Vitals: Temperature 97.4 pulse 72 respiratory 18 blood pressure 95/46 saturations 98%  Ventilator Settings on T collar 21%  . General: Comfortable at this time . Eyes: Grossly normal lids, irises & conjunctiva . ENT: grossly tongue is normal . Neck: no obvious mass . Cardiovascular: S1-S2 normal no gallop . Respiratory: No rhonchi expansion equal . Abdomen: Soft nondistended . Skin: no rash seen on limited exam . Musculoskeletal: not rigid . Psychiatric:unable to assess . Neurologic: no seizure no involuntary movements         Labs on Admission:  Basic Metabolic Panel: Recent Labs  Lab 10/22/17 0819 10/24/17 0745 10/27/17 0615  NA 131* 130* 131*  K 4.1 3.6 6.0*  CL 96* 94* 98*  CO2 22 25 19*  GLUCOSE 186* 160* 160*  BUN 70* 70* 89*  CREATININE 5.58* 5.79* 6.61*  CALCIUM 8.9 8.7* 8.7*  PHOS 3.5 2.4* 2.6    Liver Function Tests: Recent Labs  Lab 10/22/17 0819 10/24/17 0745 10/27/17 0615  ALBUMIN 2.5* 2.2* 2.3*   No results for input(s): LIPASE, AMYLASE in the last 168 hours. No results for input(s): AMMONIA in the last 168 hours.  CBC: Recent Labs  Lab 10/22/17 0819 10/24/17 1503 10/27/17 0615  WBC 8.9 18.0* 19.2*  HGB 8.9* 8.6* 8.8*  HCT 28.3* 28.7* 27.1*  MCV 80.9 81.3 79.0  PLT 193 225 273    Cardiac Enzymes: No results for input(s): CKTOTAL, CKMB, CKMBINDEX, TROPONINI in the last 168 hours.  BNP (last 3  results) No results for input(s): BNP in the last 8760 hours.  ProBNP (last 3 results) No results for input(s): PROBNP in the last 8760 hours.  Radiological Exams on Admission: Dg Chest Port 1 View  Result Date: 10/25/2017 CLINICAL DATA:  Fevers. EXAM: PORTABLE CHEST 1 VIEW COMPARISON:  10/09/2017 FINDINGS: The tracheostomy tube tip is above the carina. Right axillary and subclavian vein vascular stents identified. There is a left-sided dialysis catheter with tips in the right atrium. Asymmetric elevation of the right hemidiaphragm. No pleural effusions or edema. No airspace opacities. IMPRESSION: 1. No active disease. 2. Asymmetric elevation of right hemidiaphragm. Electronically Signed   By: Kerby Moors M.D.   On: 10/25/2017 09:17    Assessment/Plan Active Problems:   Acute on chronic respiratory failure with hypoxia (HCC)   ESRD (end stage renal disease) on dialysis ([place])   DM (diabetes mellitus) type II controlled with renal manifestation (HCC)   CAD (coronary artery disease)   1. Acute on chronic respiratory failure with hypoxia patient remains at baseline on T collar has failed several attempts at trying to downsize and capping therefore will require long-term T collar for now 2. End-stage renal disease on dialysis we will continue 3. Coronary artery disease at baseline we will continue   I have personally seen and evaluated the patient, evaluated laboratory and imaging results, formulated the assessment and plan and placed orders. The Patient requires high complexity decision making for assessment and  support.  Case was discussed on Rounds with the Respiratory Therapy Staff  Allyne Gee, MD [facility] Pulmonary Critical Care Medicine Sleep Medicine

## 2017-10-29 DIAGNOSIS — I2510 Atherosclerotic heart disease of native coronary artery without angina pectoris: Secondary | ICD-10-CM | POA: Diagnosis not present

## 2017-10-29 DIAGNOSIS — N186 End stage renal disease: Secondary | ICD-10-CM | POA: Diagnosis not present

## 2017-10-29 DIAGNOSIS — I2583 Coronary atherosclerosis due to lipid rich plaque: Secondary | ICD-10-CM | POA: Diagnosis not present

## 2017-10-29 DIAGNOSIS — J9621 Acute and chronic respiratory failure with hypoxia: Secondary | ICD-10-CM | POA: Diagnosis not present

## 2017-10-29 LAB — RENAL FUNCTION PANEL
Albumin: 2.1 g/dL — ABNORMAL LOW (ref 3.5–5.0)
Anion gap: 15 (ref 5–15)
BUN: 76 mg/dL — AB (ref 6–20)
CALCIUM: 8.8 mg/dL — AB (ref 8.9–10.3)
CHLORIDE: 95 mmol/L — AB (ref 101–111)
CO2: 22 mmol/L (ref 22–32)
CREATININE: 5.79 mg/dL — AB (ref 0.61–1.24)
GFR calc Af Amer: 11 mL/min — ABNORMAL LOW (ref >60)
GFR, EST NON AFRICAN AMERICAN: 9 mL/min — AB (ref >60)
Glucose, Bld: 177 mg/dL — ABNORMAL HIGH (ref 65–99)
Phosphorus: 3.9 mg/dL (ref 2.5–4.6)
Potassium: 3.8 mmol/L (ref 3.5–5.1)
SODIUM: 132 mmol/L — AB (ref 135–145)

## 2017-10-29 LAB — CBC
HCT: 27.8 % — ABNORMAL LOW (ref 39.0–52.0)
Hemoglobin: 8.4 g/dL — ABNORMAL LOW (ref 13.0–17.0)
MCH: 24.2 pg — ABNORMAL LOW (ref 26.0–34.0)
MCHC: 30.2 g/dL (ref 30.0–36.0)
MCV: 80.1 fL (ref 78.0–100.0)
PLATELETS: 278 10*3/uL (ref 150–400)
RBC: 3.47 MIL/uL — ABNORMAL LOW (ref 4.22–5.81)
RDW: 19.8 % — AB (ref 11.5–15.5)
WBC: 11.9 10*3/uL — ABNORMAL HIGH (ref 4.0–10.5)

## 2017-10-29 NOTE — Progress Notes (Signed)
Pulmonary [place]   PULMONARY SERVICE  PROGRESS NOTE  Date of Service:  October 29, 2017  Michael Moody  [id number]:[id number]  DOB: 1951/02/13   DOA: 09/08/2017  Referring Physician: Merton Border, MD  HPI: Michael Moody is a 67 y.o. male seen for follow up of Acute on Chronic Respiratory Failure.  Patient is at baseline on aerosolized T collar secretions are fair to moderate.  He still remains agitated requiring periodic restraints restraints  Medications: Reviewed on Rounds  Physical Exam:  Vitals:  Temperature 98.1 degrees pulse 73 respiratory 14 blood pressure 126/65 saturations 97%  Ventilator Settings  Aerosolized T collar FiO2 21%  . General: Comfortable at this time . Eyes: Grossly normal lids, irises & conjunctiva . ENT: grossly tongue is normal . Neck: no obvious mass . Cardiovascular:  S1-S2 normal no gallop or rub . Respiratory:  No rhonchi expansion is equal . Abdomen:  Soft nontender . Skin: no rash seen on limited exam . Musculoskeletal: not rigid . Psychiatric:unable to assess . Neurologic: no seizure no involuntary movements         Labs on Admission:  Basic Metabolic Panel: Recent Labs  Lab 10/24/17 0745 10/27/17 0615 10/29/17 0528  NA 130* 131* 132*  K 3.6 6.0* 3.8  CL 94* 98* 95*  CO2 25 19* 22  GLUCOSE 160* 160* 177*  BUN 70* 89* 76*  CREATININE 5.79* 6.61* 5.79*  CALCIUM 8.7* 8.7* 8.8*  PHOS 2.4* 2.6 3.9    Liver Function Tests: Recent Labs  Lab 10/24/17 0745 10/27/17 0615 10/29/17 0528  ALBUMIN 2.2* 2.3* 2.1*   No results for input(s): LIPASE, AMYLASE in the last 168 hours. No results for input(s): AMMONIA in the last 168 hours.  CBC: Recent Labs  Lab 10/24/17 1503 10/27/17 0615 10/29/17 0528  WBC 18.0* 19.2* 11.9*  HGB 8.6* 8.8* 8.4*  HCT 28.7* 27.1* 27.8*  MCV 81.3 79.0 80.1  PLT 225 273 278    Cardiac Enzymes: No results for input(s): CKTOTAL, CKMB, CKMBINDEX, TROPONINI  in the last 168 hours.  BNP (last 3 results) No results for input(s): BNP in the last 8760 hours.  ProBNP (last 3 results) No results for input(s): PROBNP in the last 8760 hours.  Radiological Exams on Admission: No results found.  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  acute on chronic Respiratory failure with hypoxia patient right now is on aerosolized T collar at baseline we will continue with 21% oxygen.  We will continue pulmonary toilet supportive care 2. Coronary artery disease at baseline 3. End-stage renal disease patient is on dialysis followed by Nephrology  4. 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 The Patient requires high complexity decision making for assessment and support.  Case was discussed on Rounds with the Respiratory Therapy Staff  Allyne Gee, MD [facility] Pulmonary Critical Care Medicine Sleep Medicine

## 2017-10-29 NOTE — Progress Notes (Signed)
[facility]  ROUNDING NOTE   Subjective:  Patient seen and evaluated during hemodialysis. Appears to be tolerating well. No restraints on.   Objective:  Vital signs in last 24 hours:  Temperature 98.4 pulse 60 respirations 16 blood pressure 122/98  Physical Exam: General: No acute distress  Head: [place]/AT OM moist  Eyes: Anicteric  Neck: Tracheostomy in place  Lungs:  Scattered rhonchi, normal effort  Heart: S1S2 no rubs  Abdomen:  Soft, nontender, bowel sounds present  Extremities: trace peripheral edema, L BKA  Neurologic: Awake follows simple commands  Skin: No lesions  Access: L IJ Permcath    Basic Metabolic Panel: Recent Labs  Lab 10/24/17 0745 10/27/17 0615 10/29/17 0528  NA 130* 131* 132*  K 3.6 6.0* 3.8  CL 94* 98* 95*  CO2 25 19* 22  GLUCOSE 160* 160* 177*  BUN 70* 89* 76*  CREATININE 5.79* 6.61* 5.79*  CALCIUM 8.7* 8.7* 8.8*  PHOS 2.4* 2.6 3.9    Liver Function Tests: Recent Labs  Lab 10/24/17 0745 10/27/17 0615 10/29/17 0528  ALBUMIN 2.2* 2.3* 2.1*   No results for input(s): LIPASE, AMYLASE in the last 168 hours. No results for input(s): AMMONIA in the last 168 hours.  CBC: Recent Labs  Lab 10/24/17 1503 10/27/17 0615 10/29/17 0528  WBC 18.0* 19.2* 11.9*  HGB 8.6* 8.8* 8.4*  HCT 28.7* 27.1* 27.8*  MCV 81.3 79.0 80.1  PLT 225 273 278    Cardiac Enzymes: No results for input(s): CKTOTAL, CKMB, CKMBINDEX, TROPONINI in the last 168 hours.  BNP: Invalid input(s): POCBNP  CBG: No results for input(s): GLUCAP in the last 168 hours.  Microbiology: Results for orders placed or performed during the hospital encounter of 09/08/17  Culture, blood (routine x 2)     Status: None   Collection Time: 10/23/17 12:30 AM  Result Value Ref Range Status   Specimen Description BLOOD RIGHT HAND  Final   Special Requests   Final    BOTTLES DRAWN AEROBIC AND ANAEROBIC Blood Culture adequate volume   Culture   Final    NO GROWTH 5  DAYS Performed at [facility], [place] [address] [place], [place] [address]    Report Status 10/28/2017 FINAL  Final  Culture, blood (routine x 2)     Status: None   Collection Time: 10/23/17 12:32 AM  Result Value Ref Range Status   Specimen Description BLOOD RIGHT HAND  Final   Special Requests   Final    BOTTLES DRAWN AEROBIC ONLY Blood Culture adequate volume   Culture   Final    NO GROWTH 5 DAYS Performed at [facility], [place] [address], [place], [place] [address]    Report Status 10/28/2017 FINAL  Final  Culture, respiratory (NON-Expectorated)     Status: None   Collection Time: 10/25/17  3:20 PM  Result Value Ref Range Status   Specimen Description TRACHEAL ASPIRATE  Final   Special Requests NONE  Final   Gram Stain   Final    ABUNDANT WBC PRESENT,BOTH PMN AND MONONUCLEAR FEW SQUAMOUS EPITHELIAL CELLS PRESENT ABUNDANT GRAM POSITIVE COCCI IN PAIRS MODERATE GRAM POSITIVE RODS MODERATE GRAM NEGATIVE RODS Performed at [facility], [place] [address], [place], [place] [address]    Culture MODERATE PSEUDOMONAS AERUGINOSA  Final   Report Status 10/27/2017 FINAL  Final   Organism ID, Bacteria PSEUDOMONAS AERUGINOSA  Final      Susceptibility   Pseudomonas aeruginosa - MIC*  CEFTAZIDIME 2 SENSITIVE Sensitive     CIPROFLOXACIN <=0.25 SENSITIVE Sensitive     GENTAMICIN 2 SENSITIVE Sensitive     IMIPENEM 2 SENSITIVE Sensitive     PIP/TAZO 8 SENSITIVE Sensitive     CEFEPIME 2 SENSITIVE Sensitive     * MODERATE PSEUDOMONAS AERUGINOSA    Coagulation Studies: No results for input(s): LABPROT, INR in the last 72 hours.  Urinalysis: No results for input(s): COLORURINE, LABSPEC, PHURINE, GLUCOSEU, HGBUR, BILIRUBINUR, KETONESUR, PROTEINUR, UROBILINOGEN, NITRITE, LEUKOCYTESUR in the last 72 hours.  Invalid input(s): APPERANCEUR    Imaging: No results found.   Medications:       Assessment/ Plan:  67 y.o. male with a PMHx of end-stage renal disease on  hemodialysis, hypertension, diabetes mellitus, coronary disease status post CABG, peripheral vascular disease status post left below the knee amputation, anemia of chronic kidney disease, and secondary hyperparathyroidism, who was admitted to [facility] on 08/23/2017 for ongoing management of respiratory failure, encephalopathy, and end-stage renal disease.   1.  ESRD on HD MWF.     Patient seen during dialysis and tolerating well.  Ultrafiltration target 1.5 kg.  2.  Anemia of chronic kidney disease.    Hemoglobin down a bit today to 8.4.  Maintain the patient on appointment.  3.  Secondary hyperparathyroidism.    Phosphorus in target range at 3.9.  4.  Acute respiratory failure.   Continue local tracheostomy care.  5.  Hyperkalemia. Potassium is down to 3.8 today and within target range.   LOS: 0 Sahiba Granholm 4/3/20192:58 PM

## 2017-10-30 DIAGNOSIS — Z992 Dependence on renal dialysis: Secondary | ICD-10-CM | POA: Diagnosis not present

## 2017-10-30 DIAGNOSIS — I2583 Coronary atherosclerosis due to lipid rich plaque: Secondary | ICD-10-CM | POA: Diagnosis not present

## 2017-10-30 DIAGNOSIS — I2510 Atherosclerotic heart disease of native coronary artery without angina pectoris: Secondary | ICD-10-CM | POA: Diagnosis not present

## 2017-10-30 DIAGNOSIS — J9621 Acute and chronic respiratory failure with hypoxia: Secondary | ICD-10-CM | POA: Diagnosis not present

## 2017-10-30 DIAGNOSIS — N186 End stage renal disease: Secondary | ICD-10-CM | POA: Diagnosis not present

## 2017-10-30 NOTE — Progress Notes (Signed)
Pulmonary [place]   PULMONARY SERVICE  PROGRESS NOTE  Date of Service: October 30, 2017  Emmit Oriley  UJW:[id number]  DOB: [birth year]   DOA: 09/08/2017  Referring Physician: Merton Border, MD  HPI: Perlie Scheuring is a 67 y.o. male seen for follow up of Acute on Chronic Respiratory Failure.  Patient remains on the T, has been on room air continues to have periodic agitation requiring restraints at times.  Patient is resting comfortably this morning was sleeping  Medications: Reviewed on Rounds  Physical Exam:  Vitals: Temperature 97.3 pulse 74 respiratory 21 blood pressure 92/51 saturations 98%  Ventilator Settings T collar room air  . General: Comfortable at this time . Eyes: Grossly normal lids, irises & conjunctiva . ENT: grossly tongue is normal . Neck: no obvious mass . Cardiovascular: S1-S2 normal no gallop or rub . Respiratory: No rhonchi expansion is equal . Abdomen: Soft nontender . Skin: no rash seen on limited exam . Musculoskeletal: not rigid . Psychiatric:unable to assess . Neurologic: no seizure no involuntary movements         Labs on Admission:  Basic Metabolic Panel: Recent Labs  Lab 10/24/17 0745 10/27/17 0615 10/29/17 0528  NA 130* 131* 132*  K 3.6 6.0* 3.8  CL 94* 98* 95*  CO2 25 19* 22  GLUCOSE 160* 160* 177*  BUN 70* 89* 76*  CREATININE 5.79* 6.61* 5.79*  CALCIUM 8.7* 8.7* 8.8*  PHOS 2.4* 2.6 3.9    Liver Function Tests: Recent Labs  Lab 10/24/17 0745 10/27/17 0615 10/29/17 0528  ALBUMIN 2.2* 2.3* 2.1*   No results for input(s): LIPASE, AMYLASE in the last 168 hours. No results for input(s): AMMONIA in the last 168 hours.  CBC: Recent Labs  Lab 10/24/17 1503 10/27/17 0615 10/29/17 0528  WBC 18.0* 19.2* 11.9*  HGB 8.6* 8.8* 8.4*  HCT 28.7* 27.1* 27.8*  MCV 81.3 79.0 80.1  PLT 225 273 278    Cardiac Enzymes: No results for input(s): CKTOTAL, CKMB, CKMBINDEX, TROPONINI in  the last 168 hours.  BNP (last 3 results) No results for input(s): BNP in the last 8760 hours.  ProBNP (last 3 results) No results for input(s): PROBNP in the last 8760 hours.  Radiological Exams on Admission: No results found.  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 Acute on chronic restaurant failure with hypoxia at this time patient is on T collar which is his baseline.  Right now not requiring any oxygen only humidification will continue with supportive care 2. Coronary artery disease at baseline 3. End-stage renal disease patient is on dialysis nephrology following   I have personally seen and evaluated the patient, evaluated laboratory and imaging results, formulated the assessment and plan and placed orders. The Patient requires high complexity decision making for assessment and support.  Case was discussed on Rounds with the Respiratory Therapy Staff  Allyne Gee, MD [facility] Pulmonary Critical Care Medicine Sleep Medicine

## 2017-10-31 LAB — RENAL FUNCTION PANEL
Albumin: 2.2 g/dL — ABNORMAL LOW (ref 3.5–5.0)
Anion gap: 15 (ref 5–15)
BUN: 72 mg/dL — AB (ref 6–20)
CHLORIDE: 94 mmol/L — AB (ref 101–111)
CO2: 24 mmol/L (ref 22–32)
Calcium: 8.7 mg/dL — ABNORMAL LOW (ref 8.9–10.3)
Creatinine, Ser: 5.92 mg/dL — ABNORMAL HIGH (ref 0.61–1.24)
GFR calc non Af Amer: 9 mL/min — ABNORMAL LOW (ref >60)
GFR, EST AFRICAN AMERICAN: 10 mL/min — AB (ref >60)
Glucose, Bld: 142 mg/dL — ABNORMAL HIGH (ref 65–99)
Phosphorus: 5.6 mg/dL — ABNORMAL HIGH (ref 2.5–4.6)
Potassium: 3.6 mmol/L (ref 3.5–5.1)
Sodium: 133 mmol/L — ABNORMAL LOW (ref 135–145)

## 2017-10-31 LAB — CBC
HCT: 26.7 % — ABNORMAL LOW (ref 39.0–52.0)
Hemoglobin: 8.3 g/dL — ABNORMAL LOW (ref 13.0–17.0)
MCH: 25 pg — AB (ref 26.0–34.0)
MCHC: 31.1 g/dL (ref 30.0–36.0)
MCV: 80.4 fL (ref 78.0–100.0)
PLATELETS: 302 10*3/uL (ref 150–400)
RBC: 3.32 MIL/uL — ABNORMAL LOW (ref 4.22–5.81)
RDW: 20 % — AB (ref 11.5–15.5)
WBC: 11.6 10*3/uL — ABNORMAL HIGH (ref 4.0–10.5)

## 2017-10-31 NOTE — Progress Notes (Signed)
[facility]  ROUNDING NOTE   Subjective:  Patient seen during dialysis treatment. Patient is seated in dialysis chair. Appears to be tolerating quite well at the moment.  Objective:  Vital signs in last 24 hours:  Temperature 97.3 pulse 87 respirations 19 blood pressure 122/60  Physical Exam: General: No acute distress  Head: [place]/AT OM moist  Eyes: Anicteric  Neck: Tracheostomy in place  Lungs:  Scattered rhonchi, normal effort  Heart: S1S2 no rubs  Abdomen:  Soft, nontender, bowel sounds present  Extremities: trace peripheral edema, L BKA  Neurologic: Sleeping currently  Skin: No lesions  Access: L IJ Permcath    Basic Metabolic Panel: Recent Labs  Lab 10/27/17 0615 10/29/17 0528  NA 131* 132*  K 6.0* 3.8  CL 98* 95*  CO2 19* 22  GLUCOSE 160* 177*  BUN 89* 76*  CREATININE 6.61* 5.79*  CALCIUM 8.7* 8.8*  PHOS 2.6 3.9    Liver Function Tests: Recent Labs  Lab 10/27/17 0615 10/29/17 0528  ALBUMIN 2.3* 2.1*   No results for input(s): LIPASE, AMYLASE in the last 168 hours. No results for input(s): AMMONIA in the last 168 hours.  CBC: Recent Labs  Lab 10/24/17 1503 10/27/17 0615 10/29/17 0528 10/31/17 0715  WBC 18.0* 19.2* 11.9* 11.6*  HGB 8.6* 8.8* 8.4* 8.3*  HCT 28.7* 27.1* 27.8* 26.7*  MCV 81.3 79.0 80.1 80.4  PLT 225 273 278 302    Cardiac Enzymes: No results for input(s): CKTOTAL, CKMB, CKMBINDEX, TROPONINI in the last 168 hours.  BNP: Invalid input(s): POCBNP  CBG: No results for input(s): GLUCAP in the last 168 hours.  Microbiology: Results for orders placed or performed during the hospital encounter of 09/08/17  Culture, blood (routine x 2)     Status: None   Collection Time: 10/23/17 12:30 AM  Result Value Ref Range Status   Specimen Description BLOOD RIGHT HAND  Final   Special Requests   Final    BOTTLES DRAWN AEROBIC AND ANAEROBIC Blood Culture adequate volume   Culture   Final    NO GROWTH 5 DAYS Performed at  [facility], [place] [address], [place], [place] [address]    Report Status 10/28/2017 FINAL  Final  Culture, blood (routine x 2)     Status: None   Collection Time: 10/23/17 12:32 AM  Result Value Ref Range Status   Specimen Description BLOOD RIGHT HAND  Final   Special Requests   Final    BOTTLES DRAWN AEROBIC ONLY Blood Culture adequate volume   Culture   Final    NO GROWTH 5 DAYS Performed at [facility], [place] [address] [place], [place] [address]    Report Status 10/28/2017 FINAL  Final  Culture, respiratory (NON-Expectorated)     Status: None   Collection Time: 10/25/17  3:20 PM  Result Value Ref Range Status   Specimen Description TRACHEAL ASPIRATE  Final   Special Requests NONE  Final   Gram Stain   Final    ABUNDANT WBC PRESENT,BOTH PMN AND MONONUCLEAR FEW SQUAMOUS EPITHELIAL CELLS PRESENT ABUNDANT GRAM POSITIVE COCCI IN PAIRS MODERATE GRAM POSITIVE RODS MODERATE GRAM NEGATIVE RODS Performed at [facility], [place] [address], [place], [place] [address]    Culture MODERATE PSEUDOMONAS AERUGINOSA  Final   Report Status 10/27/2017 FINAL  Final   Organism ID, Bacteria PSEUDOMONAS AERUGINOSA  Final      Susceptibility   Pseudomonas aeruginosa - MIC*    CEFTAZIDIME 2 SENSITIVE  Sensitive     CIPROFLOXACIN <=0.25 SENSITIVE Sensitive     GENTAMICIN 2 SENSITIVE Sensitive     IMIPENEM 2 SENSITIVE Sensitive     PIP/TAZO 8 SENSITIVE Sensitive     CEFEPIME 2 SENSITIVE Sensitive     * MODERATE PSEUDOMONAS AERUGINOSA    Coagulation Studies: No results for input(s): LABPROT, INR in the last 72 hours.  Urinalysis: No results for input(s): COLORURINE, LABSPEC, PHURINE, GLUCOSEU, HGBUR, BILIRUBINUR, KETONESUR, PROTEINUR, UROBILINOGEN, NITRITE, LEUKOCYTESUR in the last 72 hours.  Invalid input(s): APPERANCEUR    Imaging: No results found.   Medications:       Assessment/ Plan:  67 y.o. male with a PMHx of end-stage renal disease on hemodialysis,  hypertension, diabetes mellitus, coronary disease status post CABG, peripheral vascular disease status post left below the knee amputation, anemia of chronic kidney disease, and secondary hyperparathyroidism, who was admitted to [facility] on 08/23/2017 for ongoing management of respiratory failure, encephalopathy, and end-stage renal disease.   1.  ESRD on HD MWF.     Patient seen and evaluated during dialysis.  He is seated comfortably in a chair and tolerating the procedure well.  2.  Anemia of chronic kidney disease.    Hemoglobin currently 8.3.  Recommend continuation of epoetin at the next dialysis center he goes to.  3.  Secondary hyperparathyroidism.    Most recent serum phosphorus was 3.9 and at target.  4.  Acute respiratory failure.   The patient's tracheostomy continues to function well.  5.  Hyperkalemia.  Earlier in the week serum potassium was 6.0 but down to 3.8 with last BMP.  Continue to monitor as an outpatient.   LOS: 0 Michael Moody 4/5/20198:53 AM

## 2017-11-01 LAB — HEPATITIS B SURFACE ANTIGEN: HEP B S AG: NEGATIVE

## 2017-11-02 LAB — RENAL FUNCTION PANEL
ALBUMIN: 2.3 g/dL — AB (ref 3.5–5.0)
ANION GAP: 15 (ref 5–15)
BUN: 73 mg/dL — AB (ref 6–20)
CO2: 23 mmol/L (ref 22–32)
Calcium: 8.8 mg/dL — ABNORMAL LOW (ref 8.9–10.3)
Chloride: 94 mmol/L — ABNORMAL LOW (ref 101–111)
Creatinine, Ser: 5.92 mg/dL — ABNORMAL HIGH (ref 0.61–1.24)
GFR calc non Af Amer: 9 mL/min — ABNORMAL LOW (ref >60)
GFR, EST AFRICAN AMERICAN: 10 mL/min — AB (ref >60)
GLUCOSE: 171 mg/dL — AB (ref 65–99)
Phosphorus: 6.3 mg/dL — ABNORMAL HIGH (ref 2.5–4.6)
Potassium: 4.1 mmol/L (ref 3.5–5.1)
SODIUM: 132 mmol/L — AB (ref 135–145)

## 2017-11-02 LAB — CBC
HCT: 27.6 % — ABNORMAL LOW (ref 39.0–52.0)
HEMOGLOBIN: 8.7 g/dL — AB (ref 13.0–17.0)
MCH: 25.3 pg — ABNORMAL LOW (ref 26.0–34.0)
MCHC: 31.5 g/dL (ref 30.0–36.0)
MCV: 80.2 fL (ref 78.0–100.0)
PLATELETS: 409 10*3/uL — AB (ref 150–400)
RBC: 3.44 MIL/uL — AB (ref 4.22–5.81)
RDW: 20 % — ABNORMAL HIGH (ref 11.5–15.5)
WBC: 11.8 10*3/uL — ABNORMAL HIGH (ref 4.0–10.5)

## 2018-08-26 IMAGING — DX DG ABDOMEN 1V
2 series · 2 of 2 positions shown · non-contrast
Comparison: None.

CLINICAL DATA: Percutaneous gastrostomy tube placement.

EXAM:
ABDOMEN - 1 VIEW

[abdomen kub (1 of 2)]
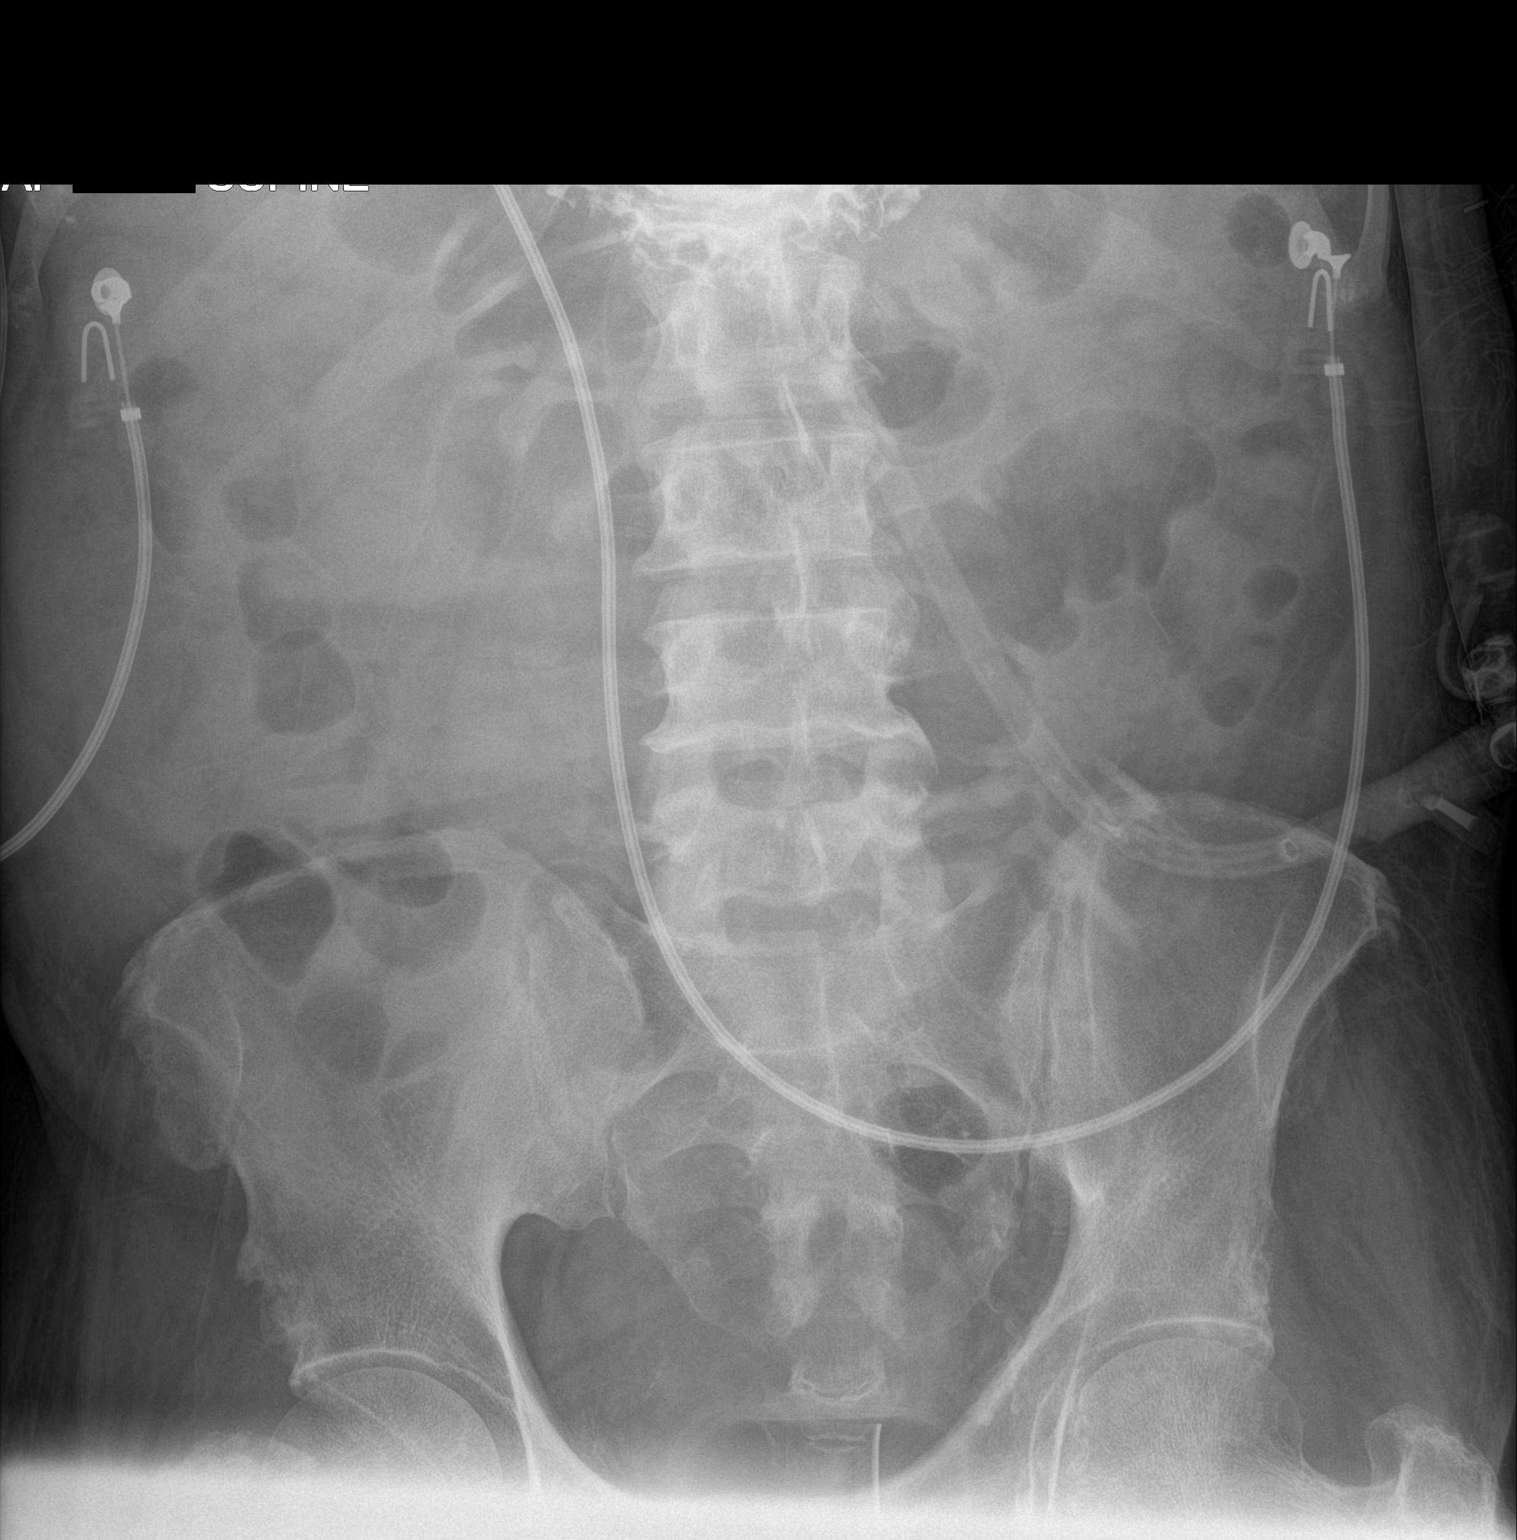

[abdomen kub (2 of 2)]
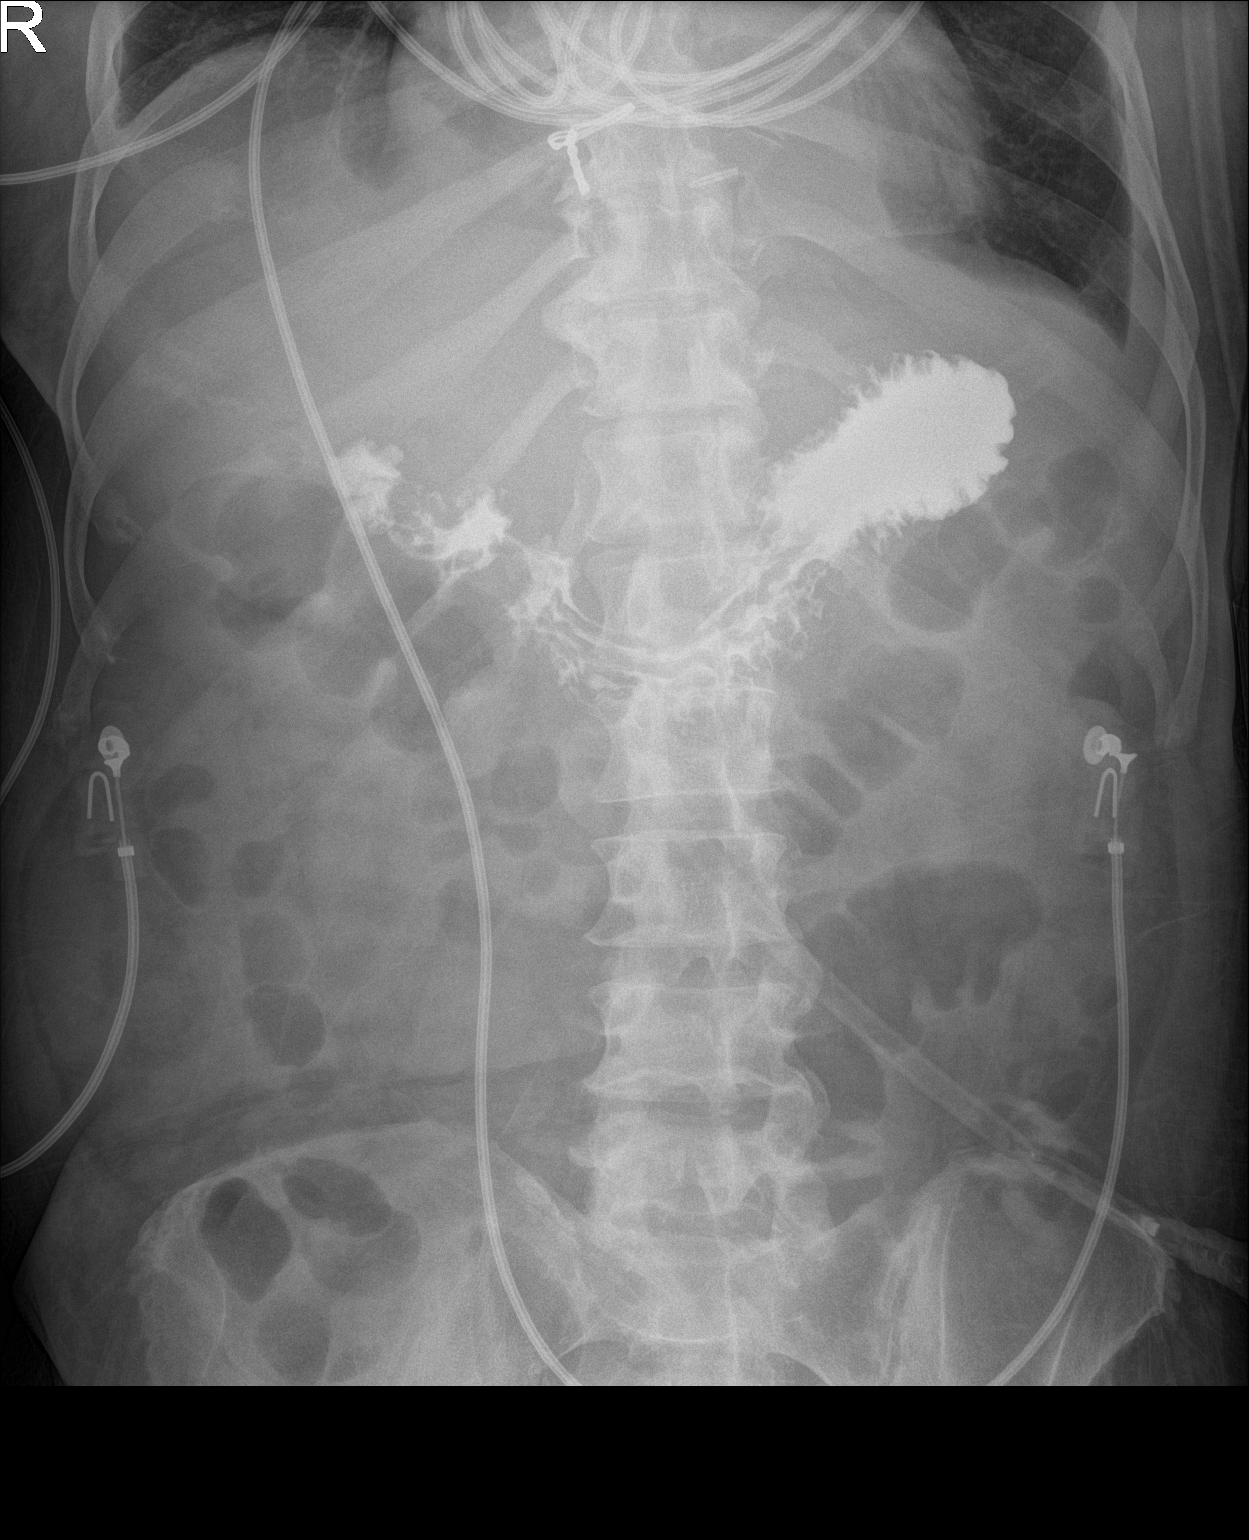

[2 of 2 positions shown; findings below may reference images not displayed]

FINDINGS: Percutaneous gastrostomy tube is seen in appropriate position, with
injected oral contrast material within the stomach. No evidence of
contrast leak. The bowel gas pattern is normal.
IMPRESSION: Percutaneous gastrostomy tube in appropriate position within the
stomach.

## 2018-10-28 IMAGING — DX DG CHEST 1V PORT
1 series · 1 of 1 positions shown · non-contrast
Comparison: 10/09/2017

CLINICAL DATA: Fevers.

EXAM:
PORTABLE CHEST 1 VIEW

[chest ap]
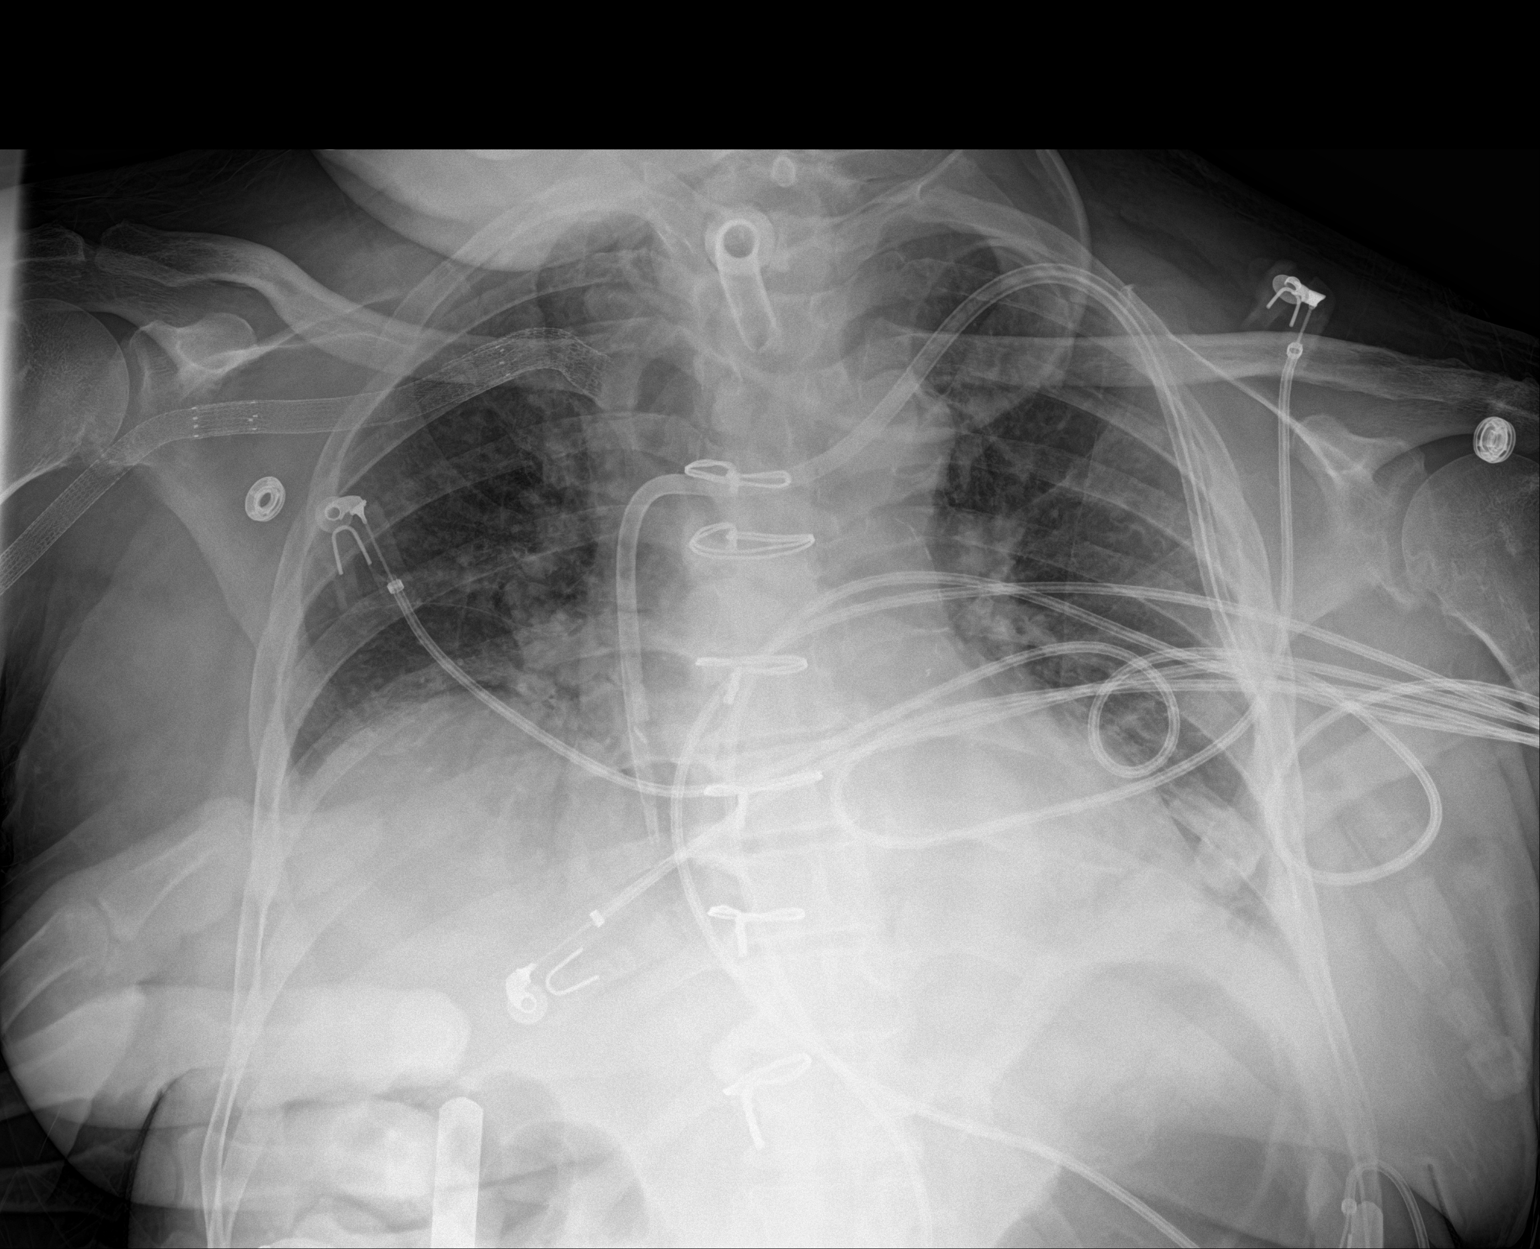

[1 of 1 positions shown; findings below may reference images not displayed]

FINDINGS: The tracheostomy tube tip is above the carina. Right axillary and
subclavian vein vascular stents identified. There is a left-sided
dialysis catheter with tips in the right atrium. Asymmetric
elevation of the right hemidiaphragm. No pleural effusions or edema.
No airspace opacities.
IMPRESSION: 1. No active disease.
2. Asymmetric elevation of right hemidiaphragm.
